# Patient Record
Sex: Female | Born: 1970 | Race: Black or African American | Hispanic: No | Smoking: Never smoker
Health system: Southern US, Community
[De-identification: ages and names within clinical notes are randomized; demographics above are authoritative.]

## PROBLEM LIST (undated history)

## (undated) DIAGNOSIS — E119 Type 2 diabetes mellitus without complications: Secondary | ICD-10-CM

## (undated) DIAGNOSIS — E785 Hyperlipidemia, unspecified: Secondary | ICD-10-CM

## (undated) DIAGNOSIS — U071 COVID-19: Secondary | ICD-10-CM

## (undated) DIAGNOSIS — I10 Essential (primary) hypertension: Secondary | ICD-10-CM

## (undated) HISTORY — DX: Hyperlipidemia, unspecified: E78.5

## (undated) HISTORY — DX: Essential (primary) hypertension: I10

## (undated) HISTORY — DX: COVID-19: U07.1

## (undated) HISTORY — PX: HAND SURGERY: SHX662

## (undated) HISTORY — DX: Type 2 diabetes mellitus without complications: E11.9

---

## 2007-02-04 ENCOUNTER — Ambulatory Visit: Payer: Self-pay | Admitting: Family Medicine

## 2009-02-19 ENCOUNTER — Ambulatory Visit: Payer: Self-pay | Admitting: Family Medicine

## 2010-03-03 ENCOUNTER — Ambulatory Visit: Payer: Self-pay | Admitting: Family Medicine

## 2011-03-15 ENCOUNTER — Ambulatory Visit: Payer: Self-pay | Admitting: Family Medicine

## 2013-10-03 DIAGNOSIS — E119 Type 2 diabetes mellitus without complications: Secondary | ICD-10-CM | POA: Insufficient documentation

## 2013-10-03 DIAGNOSIS — E785 Hyperlipidemia, unspecified: Secondary | ICD-10-CM | POA: Insufficient documentation

## 2013-10-03 DIAGNOSIS — I10 Essential (primary) hypertension: Secondary | ICD-10-CM | POA: Insufficient documentation

## 2015-09-16 ENCOUNTER — Other Ambulatory Visit: Payer: Self-pay | Admitting: Obstetrics & Gynecology

## 2015-09-16 DIAGNOSIS — Z1231 Encounter for screening mammogram for malignant neoplasm of breast: Secondary | ICD-10-CM

## 2015-10-11 ENCOUNTER — Other Ambulatory Visit: Payer: Self-pay | Admitting: Obstetrics & Gynecology

## 2015-10-11 ENCOUNTER — Ambulatory Visit
Admission: RE | Admit: 2015-10-11 | Discharge: 2015-10-11 | Disposition: A | Discharge status: Home / Self Care | Payer: 59 | Source: Ambulatory Visit | Attending: Obstetrics & Gynecology | Admitting: Obstetrics & Gynecology

## 2015-10-11 DIAGNOSIS — Z1231 Encounter for screening mammogram for malignant neoplasm of breast: Secondary | ICD-10-CM

## 2015-10-11 DIAGNOSIS — R928 Other abnormal and inconclusive findings on diagnostic imaging of breast: Secondary | ICD-10-CM | POA: Diagnosis not present

## 2015-10-14 ENCOUNTER — Other Ambulatory Visit: Payer: Self-pay | Admitting: Obstetrics & Gynecology

## 2015-10-14 DIAGNOSIS — N6320 Unspecified lump in the left breast, unspecified quadrant: Secondary | ICD-10-CM

## 2015-10-18 ENCOUNTER — Ambulatory Visit
Admission: RE | Admit: 2015-10-18 | Discharge: 2015-10-18 | Disposition: A | Discharge status: Home / Self Care | Payer: 59 | Source: Ambulatory Visit | Attending: Obstetrics & Gynecology | Admitting: Obstetrics & Gynecology

## 2015-10-18 DIAGNOSIS — N63 Unspecified lump in breast: Secondary | ICD-10-CM | POA: Diagnosis present

## 2015-10-18 DIAGNOSIS — N6320 Unspecified lump in the left breast, unspecified quadrant: Secondary | ICD-10-CM

## 2016-10-20 ENCOUNTER — Other Ambulatory Visit: Payer: Self-pay | Admitting: Obstetrics & Gynecology

## 2016-10-20 NOTE — Telephone Encounter (Signed)
Needs appt

## 2016-12-14 ENCOUNTER — Ambulatory Visit (INDEPENDENT_AMBULATORY_CARE_PROVIDER_SITE_OTHER): Payer: 59 | Admitting: Advanced Practice Midwife

## 2016-12-14 ENCOUNTER — Encounter: Payer: Self-pay | Admitting: Advanced Practice Midwife

## 2016-12-14 VITALS — BP 124/70 | Ht 63.0 in | Wt 129.0 lb

## 2016-12-14 DIAGNOSIS — Z30016 Encounter for initial prescription of transdermal patch hormonal contraceptive device: Secondary | ICD-10-CM | POA: Diagnosis not present

## 2016-12-14 DIAGNOSIS — Z01419 Encounter for gynecological examination (general) (routine) without abnormal findings: Secondary | ICD-10-CM

## 2016-12-14 DIAGNOSIS — B009 Herpesviral infection, unspecified: Secondary | ICD-10-CM | POA: Diagnosis not present

## 2016-12-14 DIAGNOSIS — Z0000 Encounter for general adult medical examination without abnormal findings: Secondary | ICD-10-CM

## 2016-12-14 MED ORDER — NORELGESTROMIN-ETH ESTRADIOL 150-35 MCG/24HR TD PTWK: 1.0000 | MEDICATED_PATCH | TRANSDERMAL | 12 refills | Status: DC

## 2016-12-14 MED ORDER — VALACYCLOVIR HCL 500 MG PO TABS: 500.0000 mg | ORAL_TABLET | Freq: Every day | ORAL | 5 refills | Status: DC

## 2016-12-14 NOTE — Patient Instructions (Signed)
Diabetes Mellitus and Food It is important for you to manage your blood sugar (glucose) level. Your blood glucose level can be greatly affected by what you eat. Eating healthier foods in the appropriate amounts throughout the day at about the same time each day will help you control your blood glucose level. It can also help slow or prevent worsening of your diabetes mellitus. Healthy eating may even help you improve the level of your blood pressure and reach or maintain a healthy weight. General recommendations for healthful eating and cooking habits include:  Eating meals and snacks regularly. Avoid going long periods of time without eating to lose weight.  Eating a diet that consists mainly of plant-based foods, such as fruits, vegetables, nuts, legumes, and whole grains.  Using low-heat cooking methods, such as baking, instead of high-heat cooking methods, such as deep frying.  Work with your dietitian to make sure you understand how to use the Nutrition Facts information on food labels. How can food affect me? Carbohydrates Carbohydrates affect your blood glucose level more than any other type of food. Your dietitian will help you determine how many carbohydrates to eat at each meal and teach you how to count carbohydrates. Counting carbohydrates is important to keep your blood glucose at a healthy level, especially if you are using insulin or taking certain medicines for diabetes mellitus. Alcohol Alcohol can cause sudden decreases in blood glucose (hypoglycemia), especially if you use insulin or take certain medicines for diabetes mellitus. Hypoglycemia can be a life-threatening condition. Symptoms of hypoglycemia (sleepiness, dizziness, and disorientation) are similar to symptoms of having too much alcohol. If your health care provider has given you approval to drink alcohol, do so in moderation and use the following guidelines:  Women should not have more than one drink per day, and men  should not have more than two drinks per day. One drink is equal to: ? 12 oz of beer. ? 5 oz of wine. ? 1 oz of hard liquor.  Do not drink on an empty stomach.  Keep yourself hydrated. Have water, diet soda, or unsweetened iced tea.  Regular soda, juice, and other mixers might contain a lot of carbohydrates and should be counted.  What foods are not recommended? As you make food choices, it is important to remember that all foods are not the same. Some foods have fewer nutrients per serving than other foods, even though they might have the same number of calories or carbohydrates. It is difficult to get your body what it needs when you eat foods with fewer nutrients. Examples of foods that you should avoid that are high in calories and carbohydrates but low in nutrients include:  Trans fats (most processed foods list trans fats on the Nutrition Facts label).  Regular soda.  Juice.  Candy.  Sweets, such as cake, pie, doughnuts, and cookies.  Fried foods.  What foods can I eat? Eat nutrient-rich foods, which will nourish your body and keep you healthy. The food you should eat also will depend on several factors, including:  The calories you need.  The medicines you take.  Your weight.  Your blood glucose level.  Your blood pressure level.  Your cholesterol level.  You should eat a variety of foods, including:  Protein. ? Lean cuts of meat. ? Proteins low in saturated fats, such as fish, egg whites, and beans. Avoid processed meats.  Fruits and vegetables. ? Fruits and vegetables that may help control blood glucose levels, such as apples,   mangoes, and yams.  Dairy products. ? Choose fat-free or low-fat dairy products, such as milk, yogurt, and cheese.  Grains, bread, pasta, and rice. ? Choose whole grain products, such as multigrain bread, whole oats, and brown rice. These foods may help control blood pressure.  Fats. ? Foods containing healthful fats, such as  nuts, avocado, olive oil, canola oil, and fish.  Does everyone with diabetes mellitus have the same meal plan? Because every person with diabetes mellitus is different, there is not one meal plan that works for everyone. It is very important that you meet with a dietitian who will help you create a meal plan that is just right for you. This information is not intended to replace advice given to you by your health care provider. Make sure you discuss any questions you have with your health care provider. Document Released: 11/24/2004 Document Revised: 08/05/2015 Document Reviewed: 01/24/2013 Elsevier Interactive Patient Education  2017 Elsevier Inc. Diabetes Mellitus and Exercise Exercising regularly is important for your overall health, especially when you have diabetes (diabetes mellitus). Exercising is not only about losing weight. It has many health benefits, such as increasing muscle strength and bone density and reducing body fat and stress. This leads to improved fitness, flexibility, and endurance, all of which result in better overall health. Exercise has additional benefits for people with diabetes, including:  Reducing appetite.  Helping to lower and control blood glucose.  Lowering blood pressure.  Helping to control amounts of fatty substances (lipids) in the blood, such as cholesterol and triglycerides.  Helping the body to respond better to insulin (improving insulin sensitivity).  Reducing how much insulin the body needs.  Decreasing the risk for heart disease by: ? Lowering cholesterol and triglyceride levels. ? Increasing the levels of good cholesterol. ? Lowering blood glucose levels.  What is my activity plan? Your health care provider or certified diabetes educator can help you make a plan for the type and frequency of exercise (activity plan) that works for you. Make sure that you:  Do at least 150 minutes of moderate-intensity or vigorous-intensity exercise each  week. This could be brisk walking, biking, or water aerobics. ? Do stretching and strength exercises, such as yoga or weightlifting, at least 2 times a week. ? Spread out your activity over at least 3 days of the week.  Get some form of physical activity every day. ? Do not go more than 2 days in a row without some kind of physical activity. ? Avoid being inactive for more than 90 minutes at a time. Take frequent breaks to walk or stretch.  Choose a type of exercise or activity that you enjoy, and set realistic goals.  Start slowly, and gradually increase the intensity of your exercise over time.  What do I need to know about managing my diabetes?  Check your blood glucose before and after exercising. ? If your blood glucose is higher than 240 mg/dL (13.3 mmol/L) before you exercise, check your urine for ketones. If you have ketones in your urine, do not exercise until your blood glucose returns to normal.  Know the symptoms of low blood glucose (hypoglycemia) and how to treat it. Your risk for hypoglycemia increases during and after exercise. Common symptoms of hypoglycemia can include: ? Hunger. ? Anxiety. ? Sweating and feeling clammy. ? Confusion. ? Dizziness or feeling light-headed. ? Increased heart rate or palpitations. ? Blurry vision. ? Tingling or numbness around the mouth, lips, or tongue. ? Tremors or shakes. ?   Irritability.  Keep a rapid-acting carbohydrate snack available before, during, and after exercise to help prevent or treat hypoglycemia.  Avoid injecting insulin into areas of the body that are going to be exercised. For example, avoid injecting insulin into: ? The arms, when playing tennis. ? The legs, when jogging.  Keep records of your exercise habits. Doing this can help you and your health care provider adjust your diabetes management plan as needed. Write down: ? Food that you eat before and after you exercise. ? Blood glucose levels before and after you  exercise. ? The type and amount of exercise you have done. ? When your insulin is expected to peak, if you use insulin. Avoid exercising at times when your insulin is peaking.  When you start a new exercise or activity, work with your health care provider to make sure the activity is safe for you, and to adjust your insulin, medicines, or food intake as needed.  Drink plenty of water while you exercise to prevent dehydration or heat stroke. Drink enough fluid to keep your urine clear or pale yellow. This information is not intended to replace advice given to you by your health care provider. Make sure you discuss any questions you have with your health care provider. Document Released: 05/20/2003 Document Revised: 09/17/2015 Document Reviewed: 08/09/2015 Elsevier Interactive Patient Education  2018 Elsevier Inc.  

## 2016-12-14 NOTE — Progress Notes (Signed)
Lori Beasley ID: Lori Beasley, female   DOB: 1970-08-27, 46 y.o.   MRN: [id number]    Gynecology Annual Exam  PCP: Lori Beasley, No Pcp Per  Chief Complaint:  Chief Complaint  Lori Beasley presents with  . Annual Exam    History of Present Illness: Lori Beasley is a 46 y.o. [name] presents for annual exam. The Lori Beasley has no complaints today. She has a request for patch for birth control and a request for refill of valtrex.  LMP: Lori Beasley's last menstrual period was 11/30/2016. Average Interval: regular, 28 days Duration of flow: 4 days Heavy Menses: no Clots: no Intermenstrual Bleeding: no Postcoital Bleeding: no Dysmenorrhea: no   The Lori Beasley is sexually active. She currently uses OCP (estrogen/progesterone) for contraception. She denies dyspareunia.  The Lori Beasley does perform self breast exams.  There is no notable family history of breast or ovarian cancer in her family.  The Lori Beasley wears seatbelts: yes.   The Lori Beasley has regular exercise: yes.    The Lori Beasley denies current symptoms of depression.    Review of Systems: Review of Systems  Constitutional: Negative.   HENT: Negative.   Eyes: Negative.   Respiratory: Negative.   Cardiovascular: Negative.   Gastrointestinal: Negative.   Genitourinary: Negative.   Musculoskeletal: Negative.   Skin: Negative.   Neurological: Negative.   Endo/Heme/Allergies: Negative.   Psychiatric/Behavioral: Negative.     Past Medical History:  Past Medical History:  Diagnosis Date  . Diabetes ([place])   . Hypertension     Past Surgical History:  History reviewed. No pertinent surgical history.  Gynecologic History:  Lori Beasley's last menstrual period was 11/30/2016. Contraception: OCP (estrogen/progesterone) Last Pap: 3 years ago Results were:  no abnormalities  Last mammogram: 1 year ago Results were: BI-RAD I Obstetric History: [id number]  Family History:  Family History  Problem Relation Age of Onset  . Breast cancer Maternal Grandmother         over 40    Social History:  Social History   Social History  . Marital status: Single    Spouse name: N/A  . Number of children: N/A  . Years of education: N/A   Occupational History  . Not on file.   Social History Main Topics  . Smoking status: Never Smoker  . Smokeless tobacco: Never Used  . Alcohol use No  . Drug use: No  . Sexual activity: Not Currently    Birth control/ protection: None   Other Topics Concern  . Not on file   Social History Narrative  . No narrative on file    Allergies:  No Known Allergies  Medications: Prior to Admission medications   Medication Sig Start Date End Date Taking? Authorizing Provider  amLODipine (NORVASC) 5 MG tablet Take by mouth. 05/28/16  Yes [provider]  Blood Glucose Monitoring Suppl (FIFTY50 GLUCOSE METER 2.0) w/Device KIT Frequency:PHARMDIR   Dosage:0.0     Instructions:  Note:To check blood sugar twice daily. ICD9 code 250.00 Dose: 1 05/13/07  Yes [provider]  glucose blood (BAYER CONTOUR TEST) test strip Frequency:QD   Dosage:0.0     Instructions:  Note:Dose: 1 06/10/07  Yes [provider]  Lancets MISC Frequency:QD   Dosage:0.0     Instructions:  Note:Dose: 1 06/10/07  Yes [provider]  lisinopril (PRINIVIL,ZESTRIL) 40 MG tablet Take by mouth. 05/28/16  Yes [provider]  metFORMIN (GLUCOPHAGE) 500 MG tablet TAKE 1 TABLET BY MOUTH 3  TIMES DAILY 10/24/16  Yes [provider]  pravastatin (PRAVACHOL) 20 MG tablet Take 40 mg by mouth. 03/21/12  Yes [provider]  norelgestromin-ethinyl estradiol (ORTHO EVRA) 150-35 MCG/24HR transdermal patch Place 1 patch onto the skin once a week. 12/14/16   Rod Can, CNM  valACYclovir (VALTREX) 500 MG tablet Take 1 tablet (500 mg total) by mouth daily. 12/14/16   Rod Can, CNM    Physical Exam Vitals: Blood pressure 124/70, height '5\' 3"'$  (1.6 m), weight 129 lb (58.5 kg), last menstrual period  11/30/2016.  General: NAD HEENT: normocephalic, anicteric Thyroid: no enlargement, no palpable nodules Pulmonary: No increased work of breathing, CTAB Cardiovascular: RRR, distal pulses 2+ Breast: Breast symmetrical, no tenderness, no palpable nodules or masses, no skin or nipple retraction present, no nipple discharge.  No axillary or supraclavicular lymphadenopathy. Abdomen: NABS, soft, non-tender, non-distended.  Umbilicus without lesions.  No hepatomegaly, splenomegaly or masses palpable. No evidence of hernia  Genitourinary: Deferred for no concerns and PAP screening interval Extremities: no edema, erythema, or tenderness Neurologic: Grossly intact Psychiatric: mood appropriate, affect full   Assessment: 46 y.o. [id number] routine annual exam  Plan: Problem List Items Addressed This Visit    None    Visit Diagnoses    Well woman exam without gynecological exam    -  Primary   Relevant Orders   MM DIGITAL SCREENING BILATERAL   Ambulatory referral to Gastroenterology   Herpes simplex infection       Relevant Medications   valACYclovir (VALTREX) 500 MG tablet   Encounter for initial prescription of transdermal patch hormonal contraceptive device       Relevant Medications   norelgestromin-ethinyl estradiol (ORTHO EVRA) 150-35 MCG/24HR transdermal patch      1) Mammogram - recommend yearly screening mammogram.  Mammogram Was ordered today   2) STI screening was offered and declined  3) ASCCP guidelines and rational discussed.  Lori Beasley opts for every 5 years screening interval  4) Contraception - Education given regarding options for contraception, including condoms, patch, ring, pills, IUD, Nexplanon, permanent sterilization. Lori Beasley chooses patch.  5) Colonoscopy -- Screening recommended starting at age 8 for average risk individuals, age 5 for individuals deemed at increased risk (including African Americans) and recommended to continue until age 31.  For Lori Beasley age  43-85 individualized approach is recommended.  Gold standard screening is via colonoscopy, Cologuard screening is an acceptable alternative for Lori Beasley unwilling or unable to undergo colonoscopy.  "Colorectal cancer screening for average?risk adults: 2018 guideline update from the [place]: A Cancer Journal for Clinicians: Aug 09, 2016. Referral sent for screening colonoscopy.  6) Routine healthcare maintenance including cholesterol, diabetes screening discussed managed by PCP   7) Continue healthy lifestyle diet and exercise and blood glucose control  8) Return to clinic in 1 year for annual exam  Rod Can, CNM

## 2017-01-17 ENCOUNTER — Other Ambulatory Visit: Payer: Self-pay | Admitting: Obstetrics & Gynecology

## 2017-04-24 ENCOUNTER — Ambulatory Visit
Admission: RE | Admit: 2017-04-24 | Discharge: 2017-04-24 | Disposition: A | Discharge status: Home / Self Care | Payer: Managed Care, Other (non HMO) | Source: Ambulatory Visit | Attending: Advanced Practice Midwife | Admitting: Advanced Practice Midwife

## 2017-04-24 DIAGNOSIS — Z1231 Encounter for screening mammogram for malignant neoplasm of breast: Secondary | ICD-10-CM | POA: Insufficient documentation

## 2017-04-24 DIAGNOSIS — Z0000 Encounter for general adult medical examination without abnormal findings: Secondary | ICD-10-CM

## 2017-04-25 ENCOUNTER — Other Ambulatory Visit: Payer: Self-pay | Admitting: Advanced Practice Midwife

## 2017-04-25 DIAGNOSIS — R928 Other abnormal and inconclusive findings on diagnostic imaging of breast: Secondary | ICD-10-CM

## 2017-04-25 DIAGNOSIS — N6489 Other specified disorders of breast: Secondary | ICD-10-CM

## 2017-05-03 ENCOUNTER — Ambulatory Visit
Admission: RE | Admit: 2017-05-03 | Discharge: 2017-05-03 | Disposition: A | Discharge status: Home / Self Care | Payer: Managed Care, Other (non HMO) | Source: Ambulatory Visit | Attending: Advanced Practice Midwife | Admitting: Advanced Practice Midwife

## 2017-05-03 DIAGNOSIS — N6489 Other specified disorders of breast: Secondary | ICD-10-CM

## 2017-05-03 DIAGNOSIS — R928 Other abnormal and inconclusive findings on diagnostic imaging of breast: Secondary | ICD-10-CM | POA: Diagnosis not present

## 2017-08-22 ENCOUNTER — Ambulatory Visit: Payer: Managed Care, Other (non HMO) | Admitting: Family Medicine

## 2017-08-22 ENCOUNTER — Encounter: Payer: Self-pay | Admitting: Family Medicine

## 2017-08-22 ENCOUNTER — Encounter (INDEPENDENT_AMBULATORY_CARE_PROVIDER_SITE_OTHER): Payer: Self-pay

## 2017-08-22 VITALS — BP 146/84 | HR 94 | Temp 98.4°F | Ht 63.5 in | Wt 130.0 lb

## 2017-08-22 DIAGNOSIS — E119 Type 2 diabetes mellitus without complications: Secondary | ICD-10-CM | POA: Diagnosis not present

## 2017-08-22 DIAGNOSIS — Z1321 Encounter for screening for nutritional disorder: Secondary | ICD-10-CM

## 2017-08-22 DIAGNOSIS — I10 Essential (primary) hypertension: Secondary | ICD-10-CM

## 2017-08-22 DIAGNOSIS — Z7689 Persons encountering health services in other specified circumstances: Secondary | ICD-10-CM | POA: Diagnosis not present

## 2017-08-22 DIAGNOSIS — Z23 Encounter for immunization: Secondary | ICD-10-CM

## 2017-08-22 MED ORDER — LISINOPRIL 40 MG PO TABS: 40.0000 mg | ORAL_TABLET | Freq: Every day | ORAL | 3 refills | Status: DC

## 2017-08-22 MED ORDER — AMLODIPINE BESYLATE 5 MG PO TABS: 5.0000 mg | ORAL_TABLET | Freq: Every day | ORAL | 3 refills | Status: DC

## 2017-08-22 NOTE — Patient Instructions (Addendum)
Great to see you today  I will notify you of your lab results  I will send in your metformin and pravachol after I get your blood work results  Please schedule your complete physical exam visit for 6 months from now

## 2017-08-22 NOTE — Progress Notes (Signed)
Subjective:    Patient ID: Lenell Antuheresa L Nicks, female    DOB: 08/29/1970, 47 y.o.   MRN: [id number]  HPI This is a 47 yo female who presents today to establish care. 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 Has 2 children and 2 grandchildren. Daughter and granddaughter live with her. She spends a lot of time with her grandchildren.    Last CPE- 04/24/17 Mammo- 04/24/17 Pap- negative 2016, follows gyn Colonoscopy- never Tdap- 02/07/2010 Flu-most years Eye- due this year  Dental- regular Exercise- walks Pneumococcal- computer showing she had 23 x 2, but never 13, will have today  DM type 2- checks occasionally, runs under 200, counts carbs and carefully watches her diet  HTN- on lisinopril and amlodipine, denies side effects  Denies chest pain, SOB, headaches, abdominal pain, nausea/vomitng, diarrhea/constipation, no dysuria/hematuria/frquency, no muscle/joint pain.     Past Medical History:  Diagnosis Date  . Diabetes ([facility])   . Hypertension    No past surgical history on file. Family History  Problem Relation Age of Onset  . Breast cancer Maternal Grandmother        over 8150   Social History   Tobacco Use  . Smoking status: Never Smoker  . Smokeless tobacco: Never Used  Substance Use Topics  . Alcohol use: No  . Drug use: No      Review of Systems    per HPI Objective:   Physical Exam Physical Exam  Constitutional: Oriented to person, place, and time. She appears well-developed and well-nourished.  HENT:  Head: Normocephalic and atraumatic.  Eyes: Conjunctivae are normal.  Neck: Normal range of motion. Neck supple.  Cardiovascular: Normal rate, regular rhythm and normal heart sounds.   Pulmonary/Chest: Effort normal and breath sounds normal.  Musculoskeletal: Normal range of motion.  Neurological: Alert and oriented to person, place, and time.  Skin: Skin is warm and dry.  Psychiatric: Normal mood and affect. Behavior is  normal. Judgment and thought content normal.  Vitals reviewed.     BP (!) 146/84 (BP Location: Right Arm, Patient Position: Sitting, Cuff Size: Normal)   Pulse 94   Temp 98.4 F (36.9 C) (Oral)   Ht 5' 3.5" (1.613 m)   Wt 130 lb (59 kg)   LMP 07/13/2017   SpO2 99%   BMI 22.67 kg/m  BP Readings from Last 3 Encounters:  08/22/17 (!) 146/84  12/14/16 124/70   Wt Readings from Last 3 Encounters:  08/22/17 130 lb (59 kg)  12/14/16 129 lb (58.5 kg)       Assessment & Plan:  1. Encounter to establish care - Discussed and encouraged healthy lifestyle choices- adequate sleep, regular exercise, stress management and healthy food choices.    2. Essential hypertension - a little high today, will continue to monitor and continue current meds - amLODipine (NORVASC) 5 MG tablet; Take 1 tablet (5 mg total) by mouth daily.  Dispense: 90 tablet; Refill: 3 - lisinopril (PRINIVIL,ZESTRIL) 40 MG tablet; Take 1 tablet (40 mg total) by mouth daily.  Dispense: 90 tablet; Refill: 3 - CBC with Differential - Comprehensive metabolic panel - Lipid Panel - TSH  3. Type 2 diabetes mellitus without complication, without long-term current use of insulin ([facility]CC) - CBC with Differential - Comprehensive metabolic panel - Lipid Panel - Hemoglobin A1c - TSH  4. Encounter for vitamin deficiency screening - Vitamin D, 25-hydroxy  5. Need for vaccination with 13-polyvalent pneumococcal conjugate vaccine - Pneumococcal conjugate  vaccine 13-valent   Olean Ree, FNP-BC  [facility], [facility]  08/22/2017 5:16 PM

## 2017-08-23 LAB — TSH: TSH: 1.2 u[IU]/mL (ref 0.35–4.50)

## 2017-08-23 LAB — CBC WITH DIFFERENTIAL/PLATELET
BASOS ABS: 0.1 10*3/uL (ref 0.0–0.1)
Basophils Relative: 1.3 % (ref 0.0–3.0)
EOS PCT: 5 % (ref 0.0–5.0)
Eosinophils Absolute: 0.5 10*3/uL (ref 0.0–0.7)
HEMATOCRIT: 40.7 % (ref 36.0–46.0)
Hemoglobin: 13.6 g/dL (ref 12.0–15.0)
LYMPHS PCT: 38.4 % (ref 12.0–46.0)
Lymphs Abs: 3.5 10*3/uL (ref 0.7–4.0)
MCHC: 33.4 g/dL (ref 30.0–36.0)
MCV: 91 fl (ref 78.0–100.0)
MONOS PCT: 7.1 % (ref 3.0–12.0)
Monocytes Absolute: 0.6 10*3/uL (ref 0.1–1.0)
NEUTROS ABS: 4.4 10*3/uL (ref 1.4–7.7)
Neutrophils Relative %: 48.2 % (ref 43.0–77.0)
Platelets: 390 10*3/uL (ref 150.0–400.0)
RBC: 4.47 Mil/uL (ref 3.87–5.11)
RDW: 13 % (ref 11.5–15.5)
WBC: 9.1 10*3/uL (ref 4.0–10.5)

## 2017-08-23 LAB — LIPID PANEL
CHOLESTEROL: 246 mg/dL — AB (ref 0–200)
HDL: 80.5 mg/dL (ref >39.00)
LDL Cholesterol: 148 mg/dL — ABNORMAL HIGH (ref 0–99)
NonHDL: 165.37
TRIGLYCERIDES: 86 mg/dL (ref 0.0–149.0)
Total CHOL/HDL Ratio: 3
VLDL: 17.2 mg/dL (ref 0.0–40.0)

## 2017-08-23 LAB — HEMOGLOBIN A1C: Hgb A1c MFr Bld: 7.1 % — ABNORMAL HIGH (ref 4.6–6.5)

## 2017-08-23 LAB — COMPREHENSIVE METABOLIC PANEL
ALK PHOS: 60 U/L (ref 39–117)
ALT: 13 U/L (ref 0–35)
AST: 15 U/L (ref 0–37)
Albumin: 4.3 g/dL (ref 3.5–5.2)
BILIRUBIN TOTAL: 0.3 mg/dL (ref 0.2–1.2)
BUN: 17 mg/dL (ref 6–23)
CALCIUM: 9.9 mg/dL (ref 8.4–10.5)
CO2: 24 mEq/L (ref 19–32)
Chloride: 103 mEq/L (ref 96–112)
Creatinine, Ser: 1.09 mg/dL (ref 0.40–1.20)
GFR: 68.75 mL/min (ref >60.00)
Glucose, Bld: 99 mg/dL (ref 70–99)
POTASSIUM: 4.9 meq/L (ref 3.5–5.1)
Sodium: 136 mEq/L (ref 135–145)
TOTAL PROTEIN: 7.3 g/dL (ref 6.0–8.3)

## 2017-08-23 LAB — VITAMIN D 25 HYDROXY (VIT D DEFICIENCY, FRACTURES): VITD: 26.05 ng/mL — ABNORMAL LOW (ref 30.00–100.00)

## 2017-08-27 MED ORDER — PRAVASTATIN SODIUM 40 MG PO TABS: 40.0000 mg | ORAL_TABLET | Freq: Every day | ORAL | 3 refills | Status: DC

## 2017-08-27 MED ORDER — METFORMIN HCL ER (MOD) 500 MG PO TB24: 500.0000 mg | ORAL_TABLET | Freq: Every day | ORAL | 3 refills | Status: DC

## 2017-08-27 NOTE — Addendum Note (Signed)
Addended by: Olean ReeGESSNER, DEBORAH B on: 08/27/2017 04:51 PM   Modules accepted: Orders

## 2017-09-05 ENCOUNTER — Other Ambulatory Visit: Payer: Self-pay | Admitting: Family Medicine

## 2017-09-05 ENCOUNTER — Telehealth: Payer: Self-pay | Admitting: Family Medicine

## 2017-09-05 MED ORDER — METFORMIN HCL ER 500 MG PO TB24: 500.0000 mg | ORAL_TABLET | Freq: Every day | ORAL | 3 refills | Status: DC

## 2017-09-05 NOTE — Telephone Encounter (Signed)
Please Advise

## 2017-09-05 NOTE — Telephone Encounter (Signed)
Copied from CRM [phone]#[id number]. Topic: Quick Communication - See Telephone Encounter >> Sep 05, 2017 11:32 AM Trula SladeWalter, Linda F wrote: CRM for notification. See Telephone encounter for: 09/05/17. Patient was told that the medication that was called into [facility] for her metFORMIN (GLUMETZA) 500 MG (MOD) 24 hr tablet was $5,000.  They said they have a generic for it if the doctor approves.  It is Metformin ER.  Can this be used?  Please advise.

## 2017-09-05 NOTE — Telephone Encounter (Signed)
Please call patient and tell her that new prescription sent to her mail order pharmacy.

## 2017-09-06 NOTE — Telephone Encounter (Signed)
Called and spoke to patient nothing further needed at this time.

## 2018-01-16 ENCOUNTER — Other Ambulatory Visit: Payer: Self-pay | Admitting: Obstetrics & Gynecology

## 2018-02-17 ENCOUNTER — Other Ambulatory Visit: Payer: Self-pay | Admitting: Family Medicine

## 2018-02-17 DIAGNOSIS — E119 Type 2 diabetes mellitus without complications: Secondary | ICD-10-CM

## 2018-02-17 DIAGNOSIS — E782 Mixed hyperlipidemia: Secondary | ICD-10-CM

## 2018-02-17 NOTE — Progress Notes (Signed)
CPE labs entered.  

## 2018-02-18 ENCOUNTER — Other Ambulatory Visit (INDEPENDENT_AMBULATORY_CARE_PROVIDER_SITE_OTHER): Payer: Managed Care, Other (non HMO)

## 2018-02-18 DIAGNOSIS — E119 Type 2 diabetes mellitus without complications: Secondary | ICD-10-CM | POA: Diagnosis not present

## 2018-02-18 DIAGNOSIS — E782 Mixed hyperlipidemia: Secondary | ICD-10-CM

## 2018-02-19 LAB — LIPID PANEL
Chol/HDL Ratio: 2.2 ratio (ref 0.0–4.4)
Cholesterol, Total: 196 mg/dL (ref 100–199)
HDL: 89 mg/dL (ref >39)
LDL Calculated: 89 mg/dL (ref 0–99)
Triglycerides: 89 mg/dL (ref 0–149)
VLDL Cholesterol Cal: 18 mg/dL (ref 5–40)

## 2018-02-19 LAB — HEMOGLOBIN A1C
ESTIMATED AVERAGE GLUCOSE: 146 mg/dL
Hgb A1c MFr Bld: 6.7 % — ABNORMAL HIGH (ref 4.8–5.6)

## 2018-02-25 ENCOUNTER — Other Ambulatory Visit (HOSPITAL_COMMUNITY)
Admission: RE | Admit: 2018-02-25 | Discharge: 2018-02-25 | Disposition: A | Discharge status: Home / Self Care | Payer: Managed Care, Other (non HMO) | Source: Ambulatory Visit | Attending: Family Medicine | Admitting: Family Medicine

## 2018-02-25 ENCOUNTER — Encounter: Payer: Self-pay | Admitting: Family Medicine

## 2018-02-25 ENCOUNTER — Ambulatory Visit (INDEPENDENT_AMBULATORY_CARE_PROVIDER_SITE_OTHER): Payer: Managed Care, Other (non HMO) | Admitting: Family Medicine

## 2018-02-25 VITALS — BP 136/78 | HR 96 | Temp 98.4°F | Ht 63.5 in | Wt 129.1 lb

## 2018-02-25 DIAGNOSIS — Z124 Encounter for screening for malignant neoplasm of cervix: Secondary | ICD-10-CM | POA: Diagnosis not present

## 2018-02-25 DIAGNOSIS — Z0000 Encounter for general adult medical examination without abnormal findings: Secondary | ICD-10-CM

## 2018-02-25 DIAGNOSIS — E119 Type 2 diabetes mellitus without complications: Secondary | ICD-10-CM | POA: Diagnosis not present

## 2018-02-25 DIAGNOSIS — M25511 Pain in right shoulder: Secondary | ICD-10-CM | POA: Diagnosis not present

## 2018-02-25 DIAGNOSIS — G8929 Other chronic pain: Secondary | ICD-10-CM

## 2018-02-25 NOTE — Progress Notes (Signed)
Subjective:    Patient ID: Lori Beasley, female    DOB: 03/12/1971, 47 y.o.   MRN: [id number]  HPI This is a 47 yo female who presents today for CPE. Has been doing well.   Last CPE- 04/24/17- gyn Mammo-05/03/17 Pap- negative 2016, sees gyn Tdap-02/07/2010 Flu- annual Eye- not this year Exercise- not as regular as before, taking care of her grandson in the afternoons.   Past Medical History:  Diagnosis Date  . Diabetes (HCC)   . Hypertension    No past surgical history on file. Family History  Problem Relation Age of Onset  . Breast cancer Maternal Grandmother        over 2350   Social History   Tobacco Use  . Smoking status: Never Smoker  . Smokeless tobacco: Never Used  Substance Use Topics  . Alcohol use: No  . Drug use: No      Review of Systems  Constitutional: Negative.   HENT: Negative.   Respiratory: Negative.   Cardiovascular: Negative.   Gastrointestinal: Negative.   Endocrine: Negative.   Genitourinary: Negative.   Musculoskeletal:       Right shoulder and upper arm pain. Was seen by ortho last year and had injection followed by oral steroids. Got better until this year. She works as a Glass blower/designerpacker and is right hand dominant. Has not taken any medication for pain. Some relief with heat.   Skin: Negative.   Allergic/Immunologic: Negative.   Neurological: Negative.   Hematological: Negative.   Psychiatric/Behavioral: Negative.        Objective:   Physical Exam Physical Exam  Constitutional: She is oriented to person, place, and time. She appears well-developed and well-nourished. No distress.  HENT:  Head: Normocephalic and atraumatic.  Right Ear: External ear normal.  Left Ear: External ear normal.  Nose: Nose normal.  Mouth/Throat: Oropharynx is clear and moist. No oropharyngeal exudate.  Eyes: Conjunctivae are normal. Pupils are equal, round, and reactive to light.  Neck: Normal range of motion. Neck supple. No JVD present. No thyromegaly  present.  Cardiovascular: Normal rate, regular rhythm, normal heart sounds and intact distal pulses.   Pulmonary/Chest: Effort normal and breath sounds normal. Right breast exhibits no inverted nipple, no mass, no nipple discharge, no skin change and no tenderness. Left breast exhibits no inverted nipple, no mass, no nipple discharge, no skin change and no tenderness. Breasts are symmetrical.  Abdominal: Soft. Bowel sounds are normal. She exhibits no distension and no mass. There is no tenderness. There is no rebound and no guarding.  Genitourinary: Vagina normal. Pelvic exam was performed with patient supine. There is no rash, tenderness, lesion or injury on the right labia. There is no rash, tenderness, lesion or injury on the left labia. Cervix exhibits no motion tenderness and no discharge. No vaginal discharge found.  Musculoskeletal: No LE edema. Right arm and shoulder with full ROM, normal strength, non tender Lymphadenopathy:    She has no cervical adenopathy.  Neurological: She is alert and oriented to person, place, and time. She has normal reflexes.  Skin: Skin is warm and dry. She is not diaphoretic.  Psychiatric: She has a normal mood and affect. Her behavior is normal. Judgment and thought content normal.  Vitals reviewed.     BP 136/78 (BP Location: Right Arm, Patient Position: Sitting)   Pulse 96   Temp 98.4 F (36.9 C) (Oral)   Ht 5' 3.5" (1.613 m)   Wt 129 lb 1.9 oz (58.6 kg)  SpO2 98%   BMI 22.51 kg/m  Wt Readings from Last 3 Encounters:  02/25/18 129 lb 1.9 oz (58.6 kg)  08/22/17 130 lb (59 kg)  12/14/16 129 lb (58.5 kg)   Diabetic foot exam: Normal inspection No skin breakdown No calluses  Normal DP pulses Normal sensation to light touch and monofilament Nails normal    Assessment & Plan:  1. Annual physical exam - Discussed and encouraged healthy lifestyle choices- adequate sleep, regular exercise, stress management and healthy food choices.    2.  Screening for cervical cancer - Cytology - PAP([facility])  3. Chronic right shoulder pain - discussed use of OTC analgesics/NSAIDs, heat, range of motion - if no improvement, consider returning to ortho or PT  4. Type 2 diabetes mellitus without complication, without long-term current use of insulin (HCC) - improved hgba1c and lipids, discussed with patient - encouraged her to make eye appointment   - follow up in 6 months   Olean Ree, FNP-BC  [facility], [facility]  02/25/2018 3:56 PM

## 2018-02-25 NOTE — Patient Instructions (Addendum)
Great to see you today  Your labs look great! Keep up the good work.   For your shoulder pain, sounds like arthritis. Can take ibuprofen 2 tablets or naprosyn 1 tablet twice a day as needed. Continue heat for comfort and do daily stretching exercises. Let me know if you want to go to physical therapy.   Please schedule your eye exam and have them send me the results  Follow up in 6 months  Keeping You Healthy  Get These Tests 1. Blood Pressure- Have your blood pressure checked once a year by your health care provider.  Normal blood pressure is 120/80. 2. Weight- Have your body mass index (BMI) calculated to screen for obesity.  BMI is measure of body fat based on height and weight.  You can also calculate your own BMI at https://www.west-esparza.com/www.nhlbisupport.com/bmi/. 3. Cholesterol- Have your cholesterol checked every 5 years starting at age 49 then yearly starting at age 10445. 4. Chlamydia, HIV, and other sexually transmitted diseases- Get screened every year until age 49, then within three months of each new sexual provider. 5. Pap Test - Every 1-5 years; discuss with your health care provider. 6. Mammogram- Every 1-2 years starting at age 49--50  Take these medicines  Calcium with Vitamin D-Your body needs 1200 mg of Calcium each day and [phone] IU of Vitamin D daily.  Your body can only absorb 500 mg of Calcium at a time so Calcium must be taken in 2 or 3 divided doses throughout the day.  Multivitamin with folic acid- Once daily if it is possible for you to become pregnant.  Get these Immunizations  Gardasil-Series of three doses; prevents HPV related illness such as genital warts and cervical cancer.  Menactra-Single dose; prevents meningitis.  Tetanus shot- Every 10 years.  Flu shot-Every year.  Take these steps 1. Do not smoke-Your healthcare provider can help you quit.  For tips on how to quit go to www.smokefree.gov or call 1-800 QUITNOW. 2. Be physically active- Exercise 5 days a week for  at least 30 minutes.  If you are not already physically active, start slow and gradually work up to 30 minutes of moderate physical activity.  Examples of moderate activity include walking briskly, dancing, swimming, bicycling, etc. 3. Breast Cancer- A self breast exam every month is important for early detection of breast cancer.  For more information and instruction on self breast exams, ask your healthcare provider or [place].eswww.womenshealth.gov/faq/breast-self-exam.cfm. 4. Eat a healthy diet- Eat a variety of healthy foods such as fruits, vegetables, whole grains, low fat milk, low fat cheeses, yogurt, lean meats, poultry and fish, beans, nuts, tofu, etc.  For more information go to www. Thenutritionsource.org 5. Drink alcohol in moderation- Limit alcohol intake to one drink or less per day. Never drink and drive. 6. Depression- Your emotional health is as important as your physical health.  If you're feeling down or losing interest in things you normally enjoy please talk to your healthcare provider about being screened for depression. 7. Dental visit- Brush and floss your teeth twice daily; visit your dentist twice a year. 8. Eye doctor- Get an eye exam at least every 2 years. 9. Helmet use- Always wear a helmet when riding a bicycle, motorcycle, rollerblading or skateboarding. 10. Safe sex- If you may be exposed to sexually transmitted infections, use a condom. 11. Seat belts- Seat belts can save your live; always wear one. 12. Smoke/Carbon Monoxide detectors- These detectors need to be installed on the appropriate level of  your home. Replace batteries at least once a year. 13. Skin cancer- When out in the sun please cover up and use sunscreen 15 SPF or higher. 14. Violence- If anyone is threatening or hurting you, please tell your healthcare provider.

## 2018-02-28 LAB — CYTOLOGY - PAP
Adequacy: ABSENT
Diagnosis: NEGATIVE
HPV: NOT DETECTED

## 2018-04-05 ENCOUNTER — Other Ambulatory Visit: Payer: Self-pay | Admitting: Obstetrics & Gynecology

## 2018-04-14 ENCOUNTER — Other Ambulatory Visit: Payer: Self-pay | Admitting: Obstetrics & Gynecology

## 2018-04-22 ENCOUNTER — Other Ambulatory Visit: Payer: Self-pay | Admitting: Family Medicine

## 2018-04-22 DIAGNOSIS — Z1231 Encounter for screening mammogram for malignant neoplasm of breast: Secondary | ICD-10-CM

## 2018-05-06 ENCOUNTER — Ambulatory Visit
Admission: RE | Admit: 2018-05-06 | Discharge: 2018-05-06 | Disposition: A | Discharge status: Home / Self Care | Payer: Managed Care, Other (non HMO) | Source: Ambulatory Visit | Attending: Family Medicine | Admitting: Family Medicine

## 2018-05-06 DIAGNOSIS — Z1231 Encounter for screening mammogram for malignant neoplasm of breast: Secondary | ICD-10-CM

## 2018-05-30 ENCOUNTER — Other Ambulatory Visit: Payer: Self-pay

## 2018-05-30 DIAGNOSIS — B009 Herpesviral infection, unspecified: Secondary | ICD-10-CM

## 2018-05-30 MED ORDER — VALACYCLOVIR HCL 500 MG PO TABS: 500.0000 mg | ORAL_TABLET | Freq: Every day | ORAL | 0 refills | Status: DC

## 2018-05-30 NOTE — Telephone Encounter (Signed)
Pt needs a refill on Valtrex. [facility]

## 2018-05-30 NOTE — Telephone Encounter (Signed)
Rx sent electronically.  

## 2018-06-12 ENCOUNTER — Other Ambulatory Visit: Payer: Self-pay

## 2018-06-12 ENCOUNTER — Encounter: Payer: Self-pay | Admitting: Family Medicine

## 2018-06-12 ENCOUNTER — Ambulatory Visit (INDEPENDENT_AMBULATORY_CARE_PROVIDER_SITE_OTHER): Payer: Managed Care, Other (non HMO) | Admitting: Family Medicine

## 2018-06-12 DIAGNOSIS — F411 Generalized anxiety disorder: Secondary | ICD-10-CM | POA: Diagnosis not present

## 2018-06-12 DIAGNOSIS — F430 Acute stress reaction: Secondary | ICD-10-CM | POA: Diagnosis not present

## 2018-06-12 DIAGNOSIS — E119 Type 2 diabetes mellitus without complications: Secondary | ICD-10-CM | POA: Diagnosis not present

## 2018-06-12 NOTE — Progress Notes (Signed)
Virtual Visit via Video Note  I connected with Lori Beasley on 06/12/18 at 11:28 AM EDT by a video enabled telemedicine application and verified that I am speaking with the correct person using two identifiers.   I discussed the limitations of evaluation and management by telemedicine and the availability of in person appointments. The patient expressed understanding and agreed to proceed.  History of Present Illness: This is a 48 year old female who requests a virtual visit today to discuss several concerns.  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  She reports that her stress level is very high and that she does not feel that she is able to continue to work at her job due to lack of support for the employees.  She reports that the workplace has not been facilitating social distancing and that she is shares an office with 2 other people and is unable to maintain at least 6 feet of separation.  She reports that there are no health checks and she is very concerned that she will bring COVID-19 home to her family.  She is not sleeping well having difficulty falling and staying asleep.  This increase in anxiety and stress are new for her.  She reports prior to this she was doing well and coping without difficulty.  She reports that her blood sugars are running in the 130-190 range which is high for her.  She continues to take her metformin daily.  She denies any recent change in her diet or activity level.  She has asked to be allowed to work from home but her request has been denied.  She was told she could go on FMLA.  Observations/Objective: The patient was alert and oriented and answers questions appropriately.  Her breathing was unlabored.  She was moderately emotional when talking about her stressors.  Assessment and Plan: 1. Anxiety as acute reaction to exceptional stress -Patient  feels that her best option is to take FMLA until reasonable risk of contracting COVID-19 has passed -Given her history of diabetes I think this is reasonable and have told her to send me necessary paperwork -We also discussed ways to decrease anxiety and improve sleep and a printed AVS with  information will be mailed -She was instructed to follow-up in a couple of weeks if no improvement in symptoms  2. Type 2 diabetes mellitus without complication, without long-term current use of insulin (HCC) -Discussed effects of stress on blood sugar and encouraged her to watch diet carefully and increase activity as able -She has follow-up on file to recheck her hemoglobin A1c   Lori Ree, FNP-BC  [facility], [facility]  06/12/2018 11:53 AM   Follow Up Instructions:    I discussed the assessment and treatment plan with the patient. The patient was provided an opportunity to ask questions and all were answered. The patient agreed with the plan and demonstrated an understanding of the instructions.   The patient was advised to call back or seek an in-person evaluation if the symptoms worsen or if the condition fails to improve as anticipated.   Emi Belfast, FNP

## 2018-06-12 NOTE — Patient Instructions (Signed)
Hi Lori Beasley, it was good to talk to you today during a virtual visit.  I am sorry things are so stressful at work right now.  As I said, I will be happy to fill out your FMLA paperwork.  Below is some information about stress.  Recommend that she try to stay to a regular routine, with regular bedtime and morning hours.  Try to get some exercise every day.  Walking outside is good for her body and her mood!  If you do not feel like things are getting better in the next couple of weeks please be in touch. Warm regards,  Tor Netters, FNP-BC   Stress Stress is a normal reaction to life events. Stress is what you feel when life demands more than you are used to, or more than you think you can handle. Some stress can be useful, such as studying for a test or meeting a deadline at work. Stress that occurs too often or for too long can cause problems. It can affect your emotional health and interfere with relationships and normal daily activities. Too much stress can weaken your body's defense system (immune system) and increase your risk for physical illness. If you already have a medical problem, stress can make it worse. What are the causes? All sorts of life events can cause stress. An event that causes stress for one person may not be stressful for another person. Major life events, whether positive or negative, commonly cause stress. Examples include:  Losing a job or starting a new job.  Losing a loved one.  Moving to a new town or home.  Getting married or divorced.  Having a baby.  Injury or illness. Less obvious life events can also cause stress, especially if they occur day after day or in combination with each other. Examples include:  Working long hours.  Driving in traffic.  Caring for children.  Being in debt.  Being in a difficult relationship. What are the signs or symptoms? Stress can cause emotional symptoms, including:  Anxiety. This is feeling worried, afraid, on  edge, overwhelmed, or out of control.  Anger, including irritation or impatience.  Depression. This is feeling sad, down, helpless, or guilty.  Trouble focusing, remembering, or making decisions. Stress can cause physical symptoms, including:  Aches and pains. These may affect your head, neck, back, stomach, or other areas of your body.  Tight muscles or a clenched jaw.  Low energy.  Trouble sleeping. Stress can cause unhealthy behaviors, including:  Eating to feel better (overeating) or skipping meals.  Working too much or putting off tasks.  Smoking, drinking alcohol, or using drugs to feel better. How is this diagnosed? Stress is diagnosed through an assessment by your health care provider. He or she may diagnose this condition based on:  Your symptoms and any stressful life events.  Your medical history.  Tests to rule out other causes of your symptoms. Depending on your condition, your health care provider may refer you to a specialist for further evaluation. How is this treated?  Stress management techniques are the recommended treatment for stress. Medicine is not typically recommended for the treatment of stress. Techniques to reduce your reaction to stressful life events include:  Stress identification. Monitor yourself for symptoms of stress and identify what causes stress for you. These skills may help you to avoid or prepare for stressful events.  Time management. Set your priorities, keep a calendar of events, and learn to say "no." Taking these actions can  help you avoid making too many commitments. Techniques for coping with stress include:  Rethinking the problem. Try to think realistically about stressful events rather than ignoring them or overreacting. Try to find the positives in a stressful situation rather than focusing on the negatives.  Exercise. Physical exercise can release both physical and emotional tension. The key is to find a form of exercise  that you enjoy and do it regularly.  Relaxation techniques. These relax the body and mind. The key is to find one or more that you enjoy and use the technique(s) regularly. Examples include: ? Meditation, deep breathing, or progressive relaxation techniques. ? Yoga or tai chi. ? Biofeedback, mindfulness techniques, or journaling. ? Listening to music, being out in nature, or participating in other hobbies.  Practicing a healthy lifestyle. Eat a balanced diet, drink plenty of water, limit or avoid caffeine, and get plenty of sleep.  Having a strong support network. Spend time with family, friends, or other people you enjoy being around. Express your feelings and talk things over with someone you trust. Counseling or talk therapy with a mental health professional may be helpful if you are having trouble managing stress on your own. Follow these instructions at home: Lifestyle   Avoid drugs.  Do not use any products that contain nicotine or tobacco, such as cigarettes and e-cigarettes. If you need help quitting, ask your health care provider.  Limit alcohol intake to no more than 1 drink a day for nonpregnant women and 2 drinks a day for men. One drink equals 12 oz of beer, 5 oz of wine, or 1 oz of hard liquor.  Do not use alcohol or drugs to relax.  Eat a balanced diet that includes fresh fruits and vegetables, whole grains, lean meats, fish, eggs, and beans, and low-fat dairy. Avoid processed foods and foods high in added fat, sugar, and salt.  Exercise at least 30 minutes on 5 or more days each week.  Get 7-8 hours of sleep each night. General instructions   Practice stress management techniques as discussed with your health care provider.  Drink enough fluid to keep your urine clear or pale yellow.  Take over-the-counter and prescription medicines only as told by your health care provider.  Keep all follow-up visits as told by your health care provider. This is important.  Contact a health care provider if:  Your symptoms get worse.  You have new symptoms.  You feel overwhelmed by your problems and can no longer manage them on your own. Get help right away if:  You have thoughts of hurting yourself or others. If you ever feel like you may hurt yourself or others, or have thoughts about taking your own life, get help right away. You can go to your nearest emergency department or call:  Your local emergency services (911 in the [place]  A suicide crisis helpline, such as the [place] at 6260743328. This is open 24 hours a day. Summary  Stress is a normal reaction to life events. It can cause problems if it happens too often or for too long.  Practicing stress management techniques is the best way to treat stress.  Counseling or talk therapy with a mental health professional may be helpful if you are having trouble managing stress on your own. This information is not intended to replace advice given to you by your health care provider. Make sure you discuss any questions you have with your health care provider. Document Released: 08/23/2000  Document Revised: 04/19/2016 Document Reviewed: 04/19/2016 Elsevier Interactive Patient Education  [employer].

## 2018-06-13 ENCOUNTER — Telehealth: Payer: Self-pay | Admitting: Family Medicine

## 2018-06-13 NOTE — Telephone Encounter (Signed)
fmla paperwork in Lori Beasley's in box For review and signature

## 2018-06-13 NOTE — Telephone Encounter (Signed)
Pt stated her start date is Monday April 6th

## 2018-06-13 NOTE — Telephone Encounter (Signed)
Spoke with pt  She will call me back to give me the start date for her fmla

## 2018-06-14 NOTE — Telephone Encounter (Signed)
Returned completed paperwork to [place]. h

## 2018-06-17 NOTE — Telephone Encounter (Signed)
Paperwork faxed °

## 2018-06-18 NOTE — Telephone Encounter (Signed)
Copy for pt °Copy for scan °Pt aware ° °

## 2018-06-20 ENCOUNTER — Telehealth: Payer: Self-pay

## 2018-06-20 NOTE — Telephone Encounter (Signed)
No need to quarantine herself unless she has symtpoms or direct first degree contact with COVID positive pt.

## 2018-06-20 NOTE — Telephone Encounter (Signed)
Pt works at [employer]; pt will start FMLA today but one of pts coworkers (coworker is not sick) daughter on 06/19/18 developed fever of 100.6, H/A and CP. pts coworker was advised by coworkers daughter physician that the daughter should be quarantined for 14 days. The coworker is not quarantined. Today the coworkers daughters symptoms were gone. Pt has no fever, cough, SOB and has not traveled and no known exposure to + corona or flu. Pt wants to know since she is in close contact with coworker at work (1-2 ft) does the pt need to quarantine herself. Harlin Heys FNP out of office.Please advise.

## 2018-06-22 ENCOUNTER — Other Ambulatory Visit: Payer: Self-pay | Admitting: Family Medicine

## 2018-06-22 DIAGNOSIS — B009 Herpesviral infection, unspecified: Secondary | ICD-10-CM

## 2018-06-24 NOTE — Telephone Encounter (Signed)
Lori Beasley notified as instructed by telephone.

## 2018-07-01 NOTE — Telephone Encounter (Signed)
Letter written and placed up front. 

## 2018-07-01 NOTE — Telephone Encounter (Signed)
Pt called to get a back to work note sometime this week, preferably Tues 4/21 or Wed 4/22. She feels she is ready to go back. [phone]

## 2018-07-02 NOTE — Telephone Encounter (Signed)
Letter faxed to [employer] at [phone] as requested.

## 2018-07-02 NOTE — Telephone Encounter (Signed)
°  Patient will come back to pick up the letter.   Patient is also needing the letter faxed to [facility] in order for her to return [place]- [phone]

## 2018-08-19 ENCOUNTER — Other Ambulatory Visit: Payer: Self-pay | Admitting: Family Medicine

## 2018-08-19 DIAGNOSIS — I10 Essential (primary) hypertension: Secondary | ICD-10-CM

## 2018-08-19 DIAGNOSIS — E119 Type 2 diabetes mellitus without complications: Secondary | ICD-10-CM

## 2018-08-19 DIAGNOSIS — E559 Vitamin D deficiency, unspecified: Secondary | ICD-10-CM

## 2018-08-30 ENCOUNTER — Ambulatory Visit: Payer: Managed Care, Other (non HMO) | Admitting: Family Medicine

## 2018-08-30 ENCOUNTER — Other Ambulatory Visit: Payer: Self-pay

## 2018-08-30 ENCOUNTER — Encounter: Payer: Self-pay | Admitting: Family Medicine

## 2018-08-30 ENCOUNTER — Ambulatory Visit (INDEPENDENT_AMBULATORY_CARE_PROVIDER_SITE_OTHER): Payer: Managed Care, Other (non HMO) | Admitting: Family Medicine

## 2018-08-30 DIAGNOSIS — I10 Essential (primary) hypertension: Secondary | ICD-10-CM | POA: Diagnosis not present

## 2018-08-30 DIAGNOSIS — E119 Type 2 diabetes mellitus without complications: Secondary | ICD-10-CM

## 2018-08-30 DIAGNOSIS — B009 Herpesviral infection, unspecified: Secondary | ICD-10-CM

## 2018-08-30 DIAGNOSIS — F430 Acute stress reaction: Secondary | ICD-10-CM

## 2018-08-30 DIAGNOSIS — F411 Generalized anxiety disorder: Secondary | ICD-10-CM | POA: Diagnosis not present

## 2018-08-30 MED ORDER — VALACYCLOVIR HCL 500 MG PO TABS: 500.0000 mg | ORAL_TABLET | Freq: Every day | ORAL | 1 refills | Status: DC

## 2018-08-30 MED ORDER — LISINOPRIL 40 MG PO TABS: 40.0000 mg | ORAL_TABLET | Freq: Every day | ORAL | 3 refills | Status: DC

## 2018-08-30 MED ORDER — AMLODIPINE BESYLATE 5 MG PO TABS: 5.0000 mg | ORAL_TABLET | Freq: Every day | ORAL | 3 refills | Status: DC

## 2018-08-30 MED ORDER — METFORMIN HCL ER 500 MG PO TB24: 500.0000 mg | ORAL_TABLET | Freq: Every day | ORAL | 3 refills | Status: DC

## 2018-08-30 MED ORDER — PRAVASTATIN SODIUM 40 MG PO TABS: 40.0000 mg | ORAL_TABLET | Freq: Every day | ORAL | 3 refills | Status: DC

## 2018-08-30 NOTE — Addendum Note (Signed)
Addended by: GESSNER, DEBORAH B on: 08/30/2018 08:21 AM   Modules accepted: Orders  

## 2018-08-30 NOTE — Addendum Note (Signed)
Addended by: Clarene Reamer B on: 08/30/2018 08:21 AM   Modules accepted: Orders

## 2018-08-30 NOTE — Patient Instructions (Signed)
Hi Lori Beasley,  It was nice to see you today for your virtual visit.  Please call the office at [phone] to schedule lab only visit.  It is time to check your diabetes blood work and urine.  Please follow-up in 6 months for your complete physical.  Warm regards,  Tor Netters, FNP-BC

## 2018-08-30 NOTE — Progress Notes (Signed)
Virtual Visit via Video Note  I connected with Lori Beasley on 08/30/18 at  8:00 AM EDT by a video enabled telemedicine application and verified that I am speaking with the correct person using two identifiers.  Location: Patient: At work Provider: [place]   I discussed the limitations of evaluation and management by telemedicine and the availability of in person appointments. The patient expressed understanding and agreed to proceed.  History of Present Illness: This is a 48 year old female who presents today for a virtual visit for 34-month follow-up of chronic medical conditions. Diabetes mellitus type 2- she reports that her blood sugars have been doing well.  She had some increased readings a couple of months ago with increased stress and anxiety related to coronavirus pandemic.  She reports that things are going better now and she has resumed walking.  Situational anxiety- at the beginning of coronavirus pandemic she had asked to be written out of work.  After several weeks she requested to go back to work and reports that things are going well and she has been coping without difficulties.  She denies chest pain, shortness of breath, lower extremity edema.   Observations/Objective: The patient is alert and answers questions appropriately.  Visible skin is unremarkable.  She is normally conversive without shortness of breath.  Her mood and affect are appropriate.   There were no vitals taken for this visit. Wt Readings from Last 3 Encounters:  02/25/18 129 lb 1.9 oz (58.6 kg)  08/22/17 130 lb (59 kg)  12/14/16 129 lb (58.5 kg)   BP Readings from Last 3 Encounters:  02/25/18 136/78  08/22/17 (!) 146/84  12/14/16 124/70     Assessment and Plan: 1. Type 2 diabetes mellitus without complication, without long-term current use of insulin (HCC) - Microalbumin / creatinine urine ratio; Future - Hemoglobin A1c; Future - pravastatin (PRAVACHOL) 40 MG tablet; Take 1  tablet (40 mg total) by mouth daily.  Dispense: 90 tablet; Refill: 3 - metFORMIN (GLUCOPHAGE-XR) 500 MG 24 hr tablet; Take 1 tablet (500 mg total) by mouth daily with breakfast.  Dispense: 90 tablet; Refill: 3  2. Essential hypertension - Microalbumin / creatinine urine ratio; Future - Hemoglobin A1c; Future - lisinopril (ZESTRIL) 40 MG tablet; Take 1 tablet (40 mg total) by mouth daily.  Dispense: 90 tablet; Refill: 3 - amLODipine (NORVASC) 5 MG tablet; Take 1 tablet (5 mg total) by mouth daily.  Dispense: 90 tablet; Refill: 3  3. Anxiety as acute reaction to exceptional stress - Patient reports resolution  4. Herpes simplex infection - will discuss changing to prn at next office visit - valACYclovir (VALTREX) 500 MG tablet; Take 1 tablet (500 mg total) by mouth daily.  Dispense: 90 tablet; Refill: 1  - follow up in 6 months for CPE  Lori Reamer, FNP-BC  [facility]  08/30/2018 8:28 AM   Follow Up Instructions: Patient without an active MyChart account so AVS was printed mailed to her home address.   I discussed the assessment and treatment plan with the patient. The patient was provided an opportunity to ask questions and all were answered. The patient agreed with the plan and demonstrated an understanding of the instructions.   The patient was advised to call back or seek an in-person evaluation if the symptoms worsen or if the condition fails to improve as anticipated.   Lori Beck, FNP

## 2018-09-12 ENCOUNTER — Other Ambulatory Visit (INDEPENDENT_AMBULATORY_CARE_PROVIDER_SITE_OTHER): Payer: Managed Care, Other (non HMO)

## 2018-09-12 ENCOUNTER — Other Ambulatory Visit: Payer: Self-pay

## 2018-09-12 DIAGNOSIS — E119 Type 2 diabetes mellitus without complications: Secondary | ICD-10-CM

## 2018-09-12 DIAGNOSIS — I10 Essential (primary) hypertension: Secondary | ICD-10-CM

## 2018-09-12 DIAGNOSIS — E559 Vitamin D deficiency, unspecified: Secondary | ICD-10-CM

## 2018-09-13 LAB — COMPREHENSIVE METABOLIC PANEL
ALT: 17 IU/L (ref 0–32)
AST: 19 IU/L (ref 0–40)
Albumin/Globulin Ratio: 1.7 (ref 1.2–2.2)
Albumin: 4.3 g/dL (ref 3.8–4.8)
Alkaline Phosphatase: 67 IU/L (ref 39–117)
BUN/Creatinine Ratio: 20 (ref 9–23)
BUN: 16 mg/dL (ref 6–24)
Bilirubin Total: <0.2 mg/dL (ref 0.0–1.2)
CO2: 19 mmol/L — ABNORMAL LOW (ref 20–29)
Calcium: 9 mg/dL (ref 8.7–10.2)
Chloride: 103 mmol/L (ref 96–106)
Creatinine, Ser: 0.8 mg/dL (ref 0.57–1.00)
GFR calc Af Amer: 100 mL/min/{1.73_m2} (ref >59)
GFR calc non Af Amer: 87 mL/min/{1.73_m2} (ref >59)
Globulin, Total: 2.6 g/dL (ref 1.5–4.5)
Glucose: 156 mg/dL — ABNORMAL HIGH (ref 65–99)
Potassium: 4.2 mmol/L (ref 3.5–5.2)
Sodium: 140 mmol/L (ref 134–144)
Total Protein: 6.9 g/dL (ref 6.0–8.5)

## 2018-09-13 LAB — HEMOGLOBIN A1C
Est. average glucose Bld gHb Est-mCnc: 186 mg/dL
Hgb A1c MFr Bld: 8.1 % — ABNORMAL HIGH (ref 4.8–5.6)

## 2018-09-13 LAB — VITAMIN D 25 HYDROXY (VIT D DEFICIENCY, FRACTURES): Vit D, 25-Hydroxy: 53.7 ng/mL (ref 30.0–100.0)

## 2018-09-13 LAB — MICROALBUMIN / CREATININE URINE RATIO
Creatinine, Urine: 37.3 mg/dL
Microalb/Creat Ratio: <8 mg/g creat (ref 0–29)
Microalbumin, Urine: <3 ug/mL

## 2018-09-15 MED ORDER — METFORMIN HCL ER 500 MG PO TB24: 500.0000 mg | ORAL_TABLET | Freq: Two times a day (BID) | ORAL | 3 refills | Status: DC

## 2018-09-15 NOTE — Addendum Note (Signed)
Addended by: Clarene Reamer B on: 09/15/2018 02:24 PM   Modules accepted: Orders

## 2018-12-18 ENCOUNTER — Ambulatory Visit (INDEPENDENT_AMBULATORY_CARE_PROVIDER_SITE_OTHER): Payer: Managed Care, Other (non HMO) | Admitting: Family Medicine

## 2018-12-18 ENCOUNTER — Encounter: Payer: Self-pay | Admitting: Family Medicine

## 2018-12-18 VITALS — Ht 63.5 in | Wt 129.1 lb

## 2018-12-18 DIAGNOSIS — F430 Acute stress reaction: Secondary | ICD-10-CM | POA: Diagnosis not present

## 2018-12-18 DIAGNOSIS — E119 Type 2 diabetes mellitus without complications: Secondary | ICD-10-CM

## 2018-12-18 DIAGNOSIS — F411 Generalized anxiety disorder: Secondary | ICD-10-CM

## 2018-12-18 NOTE — Progress Notes (Signed)
Virtual Visit via Video Note  I connected with Elwin Sleight on 12/18/18 at  3:30 PM EDT by a video enabled telemedicine application and verified that I am speaking with the correct person using two identifiers.  Location: Patient: At her home Provider: [place], Kathe Becton NP student also present for call.    I discussed the limitations of evaluation and management by telemedicine and the availability of in person appointments. The patient expressed understanding and agreed to proceed.  History of Present Illness: Chief Complaint  Patient presents with  . Diabetes    No new complaints. BS in range - 120s  This is a 48 year old female who presents today for virtual visit for follow-up of diabetes mellitus type 2.  3 months ago she had hemoglobin A1c elevated to 8.1 from 6.7 6 months prior.  Her Glucophage xr 500 mg was increased from daily to twice daily.  She reports that she has been tolerating her medication increase without side effects.  She has been checking her blood sugars regularly and they are running in the low 100s, mostly in the 120s. She works at [employer] and struggled at the beginning of the pandemic with anxiety.  She reports that work is going okay, she does not feel that they are very good about social distancing.  She reports that she always wears her mask and no one in her area has had the virus that she knows of.  She feels that she is coping well.    Observations/Objective: Patient is alert and answers questions appropriately.  Visible skin is unremarkable.  She is normally conversive without shortness of breath, audible wheeze or witnessed cough.  Mood and affect are appropriate. Ht 5' 3.5" (1.613 m)   Wt 129 lb 1.9 oz (58.6 kg)   BMI 22.51 kg/m  Wt Readings from Last 3 Encounters:  12/18/18 129 lb 1.9 oz (58.6 kg)  02/25/18 129 lb 1.9 oz (58.6 kg)  08/22/17 130 lb (59 kg)    Assessment and Plan: 1. Type 2 diabetes mellitus without complication,  without long-term current use of insulin (HCC) -Improved home readings on increased dose of metformin xr -Follow-up in 3 months for annual exam and labs  2. Anxiety as acute reaction to exceptional stress -Improved per patient, will recheck at follow-up visit with GAD 7   Clarene Reamer, FNP-BC  [facility] at [facility], [place]  12/21/2018 7:50 AM   Follow Up Instructions:    I discussed the assessment and treatment plan with the patient. The patient was provided an opportunity to ask questions and all were answered. The patient agreed with the plan and demonstrated an understanding of the instructions.   The patient was advised to call back or seek an in-person evaluation if the symptoms worsen or if the condition fails to improve as anticipated.   Elby Beck, FNP

## 2018-12-21 ENCOUNTER — Encounter: Payer: Self-pay | Admitting: Family Medicine

## 2018-12-23 ENCOUNTER — Other Ambulatory Visit: Payer: Self-pay

## 2018-12-23 ENCOUNTER — Ambulatory Visit: Payer: Managed Care, Other (non HMO) | Admitting: Podiatry

## 2018-12-23 ENCOUNTER — Encounter: Payer: Self-pay | Admitting: Podiatry

## 2018-12-23 DIAGNOSIS — L603 Nail dystrophy: Secondary | ICD-10-CM | POA: Diagnosis not present

## 2018-12-23 NOTE — Progress Notes (Signed)
  Subjective:  Patient ID: Lori Beasley, female    DOB: 1970/05/30,  MRN: [id number] HPI Chief Complaint  Patient presents with  . Nail Problem    Hallux bilateral - toenail thick and discolored in areas x few months, injury to left before, not sore, no treatment  . New Patient (Initial Visit)    48 y.o. female presents with the above complaint.   ROS: Denies fever chills nausea vomiting muscle aches pains calf pain back pain chest pain shortness of breath.  Past Medical History:  Diagnosis Date  . Diabetes ([place])   . Hypertension    No past surgical history on file.  Current Outpatient Medications:  .  amLODipine (NORVASC) 5 MG tablet, Take 1 tablet (5 mg total) by mouth daily., Disp: 90 tablet, Rfl: 3 .  Blood Glucose Monitoring Suppl (FIFTY50 GLUCOSE METER 2.0) w/Device KIT, Frequency:PHARMDIR   Dosage:0.0     Instructions:  Note:To check blood sugar twice daily. ICD9 code 250.00 Dose: 1, Disp: , Rfl:  .  glucose blood (BAYER CONTOUR TEST) test strip, Frequency:QD   Dosage:0.0     Instructions:  Note:Dose: 1, Disp: , Rfl:  .  Lancets MISC, Frequency:QD   Dosage:0.0     Instructions:  Note:Dose: 1, Disp: , Rfl:  .  lisinopril (ZESTRIL) 40 MG tablet, Take 1 tablet (40 mg total) by mouth daily., Disp: 90 tablet, Rfl: 3 .  metFORMIN (GLUCOPHAGE-XR) 500 MG 24 hr tablet, Take 1 tablet (500 mg total) by mouth 2 (two) times daily with a meal., Disp: 180 tablet, Rfl: 3 .  pravastatin (PRAVACHOL) 40 MG tablet, Take 1 tablet (40 mg total) by mouth daily., Disp: 90 tablet, Rfl: 3 .  SPRINTEC 28 0.25-35 MG-MCG tablet, TAKE 1 TABLET BY MOUTH  DAILY, Disp: 84 tablet, Rfl: 0 .  valACYclovir (VALTREX) 500 MG tablet, Take 1 tablet (500 mg total) by mouth daily., Disp: 90 tablet, Rfl: 1  No Known Allergies Review of Systems Objective:  There were no vitals filed for this visit.  General: Well developed, nourished, in no acute distress, alert and oriented x3   Dermatological: Skin is warm,  dry and supple bilateral. Nails x 10 are well maintained; remaining integument appears unremarkable at this time. There are no open sores, no preulcerative lesions, no rash or signs of infection present.  The distal lateral quarter of the toenail is darkened and yellowed this is bilaterally symmetrical most likely biomechanical.  He also has a transverse ridge just to the level of the central line of demarcation.  Vascular: Dorsalis Pedis artery and Posterior Tibial artery pedal pulses are 2/4 bilateral with immedate capillary fill time. Pedal hair growth present. No varicosities and no lower extremity edema present bilateral.   Neruologic: Grossly intact via light touch bilateral. Vibratory intact via tuning fork bilateral. Protective threshold with Semmes Wienstein monofilament intact to all pedal sites bilateral. Patellar and Achilles deep tendon reflexes 2+ bilateral. No Babinski or clonus noted bilateral.   Musculoskeletal: No gross boney pedal deformities bilateral. No pain, crepitus, or limitation noted with foot and ankle range of motion bilateral. Muscular strength 5/5 in all groups tested bilateral.  Gait: Unassisted, Nonantalgic.    Radiographs:  None taken  Assessment & Plan:   Assessment: Nail dystrophy cannot rule out onychomycosis  Plan: Samples of the skin and nail were taken today for pathologic evaluation.     Chibuikem Thang T. [place], [place]

## 2019-01-29 ENCOUNTER — Ambulatory Visit (INDEPENDENT_AMBULATORY_CARE_PROVIDER_SITE_OTHER): Payer: Managed Care, Other (non HMO) | Admitting: Podiatry

## 2019-01-29 ENCOUNTER — Other Ambulatory Visit: Payer: Self-pay

## 2019-01-29 DIAGNOSIS — L603 Nail dystrophy: Secondary | ICD-10-CM

## 2019-01-29 DIAGNOSIS — Z79899 Other long term (current) drug therapy: Secondary | ICD-10-CM | POA: Diagnosis not present

## 2019-01-29 MED ORDER — TERBINAFINE HCL 250 MG PO TABS: 250.0000 mg | ORAL_TABLET | Freq: Every day | ORAL | 0 refills | Status: DC

## 2019-01-29 NOTE — Patient Instructions (Signed)
Terbinafine tablets What is this medicine? TERBINAFINE (TER bin a feen) is an antifungal medicine. It is used to treat certain kinds of fungal or yeast infections. This medicine may be used for other purposes; ask your health care provider or pharmacist if you have questions. COMMON BRAND NAME(S): Lamisil, Terbinex What should I tell my health care provider before I take this medicine? They need to know if you have any of these conditions:  drink alcoholic beverages  kidney disease  liver disease  an unusual or allergic reaction to terbinafine, other medicines, foods, dyes, or preservatives  pregnant or trying to get pregnant  breast-feeding How should I use this medicine? Take this medicine by mouth with a full glass of water. Follow the directions on the prescription label. You can take this medicine with food or on an empty stomach. Take your medicine at regular intervals. Do not take your medicine more often than directed. Do not skip doses or stop your medicine early even if you feel better. Do not stop taking except on your doctor's advice. Talk to your pediatrician regarding the use of this medicine in children. Special care may be needed. Overdosage: If you think you have taken too much of this medicine contact a poison control center or emergency room at once. NOTE: This medicine is only for you. Do not share this medicine with others. What if I miss a dose? If you miss a dose, take it as soon as you can. If it is almost time for your next dose, take only that dose. Do not take double or extra doses. What may interact with this medicine? Do not take this medicine with any of the following medications:  thioridazine This medicine may also interact with the following medications:  beta-blockers  caffeine  cimetidine  cyclosporine  medicines for depression, anxiety, or psychotic disturbances  medicines for fungal infections like fluconazole and ketoconazole  medicines  for irregular heartbeat like amiodarone, flecainide and propafenone  rifampin  warfarin This list may not describe all possible interactions. Give your health care provider a list of all the medicines, herbs, non-prescription drugs, or dietary supplements you use. Also tell them if you smoke, drink alcohol, or use illegal drugs. Some items may interact with your medicine. What should I watch for while using this medicine? Visit your doctor or health care provider regularly. Tell your doctor right away if you have nausea or vomiting, loss of appetite, stomach pain on your right upper side, yellow skin, dark urine, light stools, or are over tired. Some fungal infections need many weeks or months of treatment to cure. If you are taking this medicine for a long time, you will need to have important blood work done. This medicine may cause serious skin reactions. They can happen weeks to months after starting the medicine. Contact your health care provider right away if you notice fevers or flu-like symptoms with a rash. The rash may be red or purple and then turn into blisters or peeling of the skin. Or, you might notice a red rash with swelling of the face, lips or lymph nodes in your neck or under your arms. What side effects may I notice from receiving this medicine? Side effects that you should report to your doctor or health care professional as soon as possible:  allergic reactions like skin rash or hives, swelling of the face, lips, or tongue  changes in vision  dark urine  fever or infection  general ill feeling or flu-like symptoms    light-colored stools  loss of appetite, nausea  rash, fever, and swollen lymph nodes  redness, blistering, peeling or loosening of the skin, including inside the mouth  right upper belly pain  unusually weak or tired  yellowing of the eyes or skin Side effects that usually do not require medical attention (report to your doctor or health care  professional if they continue or are bothersome):  changes in taste  diarrhea  hair loss  muscle or joint pain  stomach gas  stomach upset This list may not describe all possible side effects. Call your doctor for medical advice about side effects. You may report side effects to FDA at 1-800-FDA-1088. Where should I keep my medicine? Keep out of the reach of children. Store at room temperature below 25 degrees C (77 degrees F). Protect from light. Throw away any unused medicine after the expiration date. NOTE: This sheet is a summary. It may not cover all possible information. If you have questions about this medicine, talk to your doctor, pharmacist, or health care provider.  2020 Elsevier/Gold Standard (2018-06-07 15:37:07)  

## 2019-01-29 NOTE — Progress Notes (Signed)
She presents today for pathology results.  Objective: Vital signs are stable she is alert and oriented x3 pathology report does demonstrate nail dystrophy as well as septated hyphae such as saprophytic fungus.  No fungus could be specifically identified with PCR.  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  Plan: Discussed etiology pathology conservative versus surgical therapies at this point we discussed oral therapy we will go ahead and request liver profile as a baseline start her on first 30 days of Lamisil 250 mg tablets 1 p.o. daily and I will follow-up with her in 1 month.  We discussed the pros and cons of use of this oral medications and the risks involved she understands and is amenable to it.

## 2019-02-06 LAB — HEPATIC FUNCTION PANEL
ALT: 16 IU/L (ref 0–32)
AST: 16 IU/L (ref 0–40)
Albumin: 4.2 g/dL (ref 3.8–4.8)
Alkaline Phosphatase: 75 IU/L (ref 39–117)
Bilirubin Total: 0.2 mg/dL (ref 0.0–1.2)
Bilirubin, Direct: 0.1 mg/dL (ref 0.00–0.40)
Total Protein: 6.8 g/dL (ref 6.0–8.5)

## 2019-02-12 ENCOUNTER — Telehealth: Payer: Self-pay

## 2019-02-12 NOTE — Telephone Encounter (Signed)
Patient notified of results via voice mail °

## 2019-02-12 NOTE — Telephone Encounter (Signed)
-----   Message from Garrel Ridgel, [place] sent at 02/12/2019  6:55 AM EST ----- Blood work looks perfect we will follow-up with her at next scheduled appointment.  She may continue medication.

## 2019-03-05 ENCOUNTER — Ambulatory Visit: Payer: Managed Care, Other (non HMO) | Admitting: Podiatry

## 2019-04-09 ENCOUNTER — Other Ambulatory Visit: Payer: Self-pay | Admitting: Family Medicine

## 2019-04-09 DIAGNOSIS — Z1231 Encounter for screening mammogram for malignant neoplasm of breast: Secondary | ICD-10-CM

## 2019-04-30 ENCOUNTER — Telehealth: Payer: Self-pay | Admitting: Podiatry

## 2019-04-30 NOTE — Telephone Encounter (Signed)
Pt. Needs a refill on medication. Patient uses [facility] on [address].

## 2019-04-30 NOTE — Telephone Encounter (Signed)
Called patient - she would like a refill on her terbinafine. I advised she would need an appt prior to any refills, bloodwork is required. Patient understood and will keep scheduled appt.

## 2019-05-21 ENCOUNTER — Encounter: Payer: Self-pay | Admitting: Podiatry

## 2019-05-21 ENCOUNTER — Ambulatory Visit: Payer: Managed Care, Other (non HMO) | Admitting: Podiatry

## 2019-05-21 ENCOUNTER — Other Ambulatory Visit: Payer: Self-pay

## 2019-05-21 VITALS — Temp 98.4°F

## 2019-05-21 DIAGNOSIS — Z79899 Other long term (current) drug therapy: Secondary | ICD-10-CM

## 2019-05-21 MED ORDER — TERBINAFINE HCL 250 MG PO TABS: 250.0000 mg | ORAL_TABLET | Freq: Every day | ORAL | 0 refills | Status: DC

## 2019-05-21 NOTE — Progress Notes (Signed)
She presents today for follow-up of her nail fungus she is completed her first 30 days of Lamisil and has just done so because of a delay at the pharmacy.  She denies fever chills nausea vomiting muscle aches pains denies any new medications she denies any itching or rashes.  Objective: No change in her nail plate as of yet.  Pulses remain palpable.  Assessment: Onychomycosis long-term therapy with Lamisil.  Plan: Go ahead and got her started on her 90-day dose of Lamisil today and send her out for blood work she got liver profile come back abnormal we will notify her immediately.  Otherwise I will follow-up with her in 4 months

## 2019-05-22 LAB — HEPATIC FUNCTION PANEL
ALT: 16 IU/L (ref 0–32)
AST: 15 IU/L (ref 0–40)
Albumin: 4.6 g/dL (ref 3.8–4.8)
Alkaline Phosphatase: 81 IU/L (ref 39–117)
Bilirubin Total: <0.2 mg/dL (ref 0.0–1.2)
Bilirubin, Direct: 0.06 mg/dL (ref 0.00–0.40)
Total Protein: 7 g/dL (ref 6.0–8.5)

## 2019-05-29 ENCOUNTER — Telehealth: Payer: Self-pay

## 2019-05-29 NOTE — Telephone Encounter (Signed)
-----   Message from Marissa Nestle, RN sent at 05/23/2019  8:07 AM EST ----- Angie, please assist pt. Joya San ----- Message ----- From: Elinor Parkinson, [place] Sent: 05/22/2019  12:33 PM EST To: Marissa Nestle, RN  Blood work looks perfect you may continue to take medication.

## 2019-05-29 NOTE — Telephone Encounter (Signed)
Patient informed of normal lab results and instructed to continue medication per Dr. Al Corpus

## 2019-06-09 ENCOUNTER — Ambulatory Visit
Admission: RE | Admit: 2019-06-09 | Discharge: 2019-06-09 | Disposition: A | Discharge status: Home / Self Care | Payer: Managed Care, Other (non HMO) | Source: Ambulatory Visit | Attending: Family Medicine | Admitting: Family Medicine

## 2019-06-09 DIAGNOSIS — Z1231 Encounter for screening mammogram for malignant neoplasm of breast: Secondary | ICD-10-CM | POA: Insufficient documentation

## 2019-06-24 ENCOUNTER — Other Ambulatory Visit: Payer: Self-pay | Admitting: Family Medicine

## 2019-06-24 DIAGNOSIS — I10 Essential (primary) hypertension: Secondary | ICD-10-CM

## 2019-06-24 DIAGNOSIS — E119 Type 2 diabetes mellitus without complications: Secondary | ICD-10-CM

## 2019-07-10 ENCOUNTER — Other Ambulatory Visit: Payer: Self-pay | Admitting: Family Medicine

## 2019-07-10 DIAGNOSIS — E119 Type 2 diabetes mellitus without complications: Secondary | ICD-10-CM

## 2019-07-10 DIAGNOSIS — I10 Essential (primary) hypertension: Secondary | ICD-10-CM

## 2019-07-21 ENCOUNTER — Telehealth: Payer: Self-pay

## 2019-07-21 MED ORDER — TERBINAFINE HCL 250 MG PO TABS: 250.0000 mg | ORAL_TABLET | Freq: Every day | ORAL | 0 refills | Status: DC

## 2019-07-21 NOTE — Telephone Encounter (Signed)
Patient called requesting the last refill of Terbinafine be sent to [facility].  Per Dr Al Corpus, ok to send to [facility]     Script has been sent and patient has been notified

## 2019-07-26 ENCOUNTER — Other Ambulatory Visit: Payer: Self-pay | Admitting: Family Medicine

## 2019-07-26 DIAGNOSIS — E119 Type 2 diabetes mellitus without complications: Secondary | ICD-10-CM

## 2019-08-20 IMAGING — MG DIGITAL SCREENING BILATERAL MAMMOGRAM WITH TOMO AND CAD
8 series · 8 of 24 positions shown · non-contrast
Comparison: Previous exam(s).

CLINICAL DATA: Screening.

EXAM:
DIGITAL SCREENING BILATERAL MAMMOGRAM WITH TOMO AND CAD

[L MLO synth-2D]
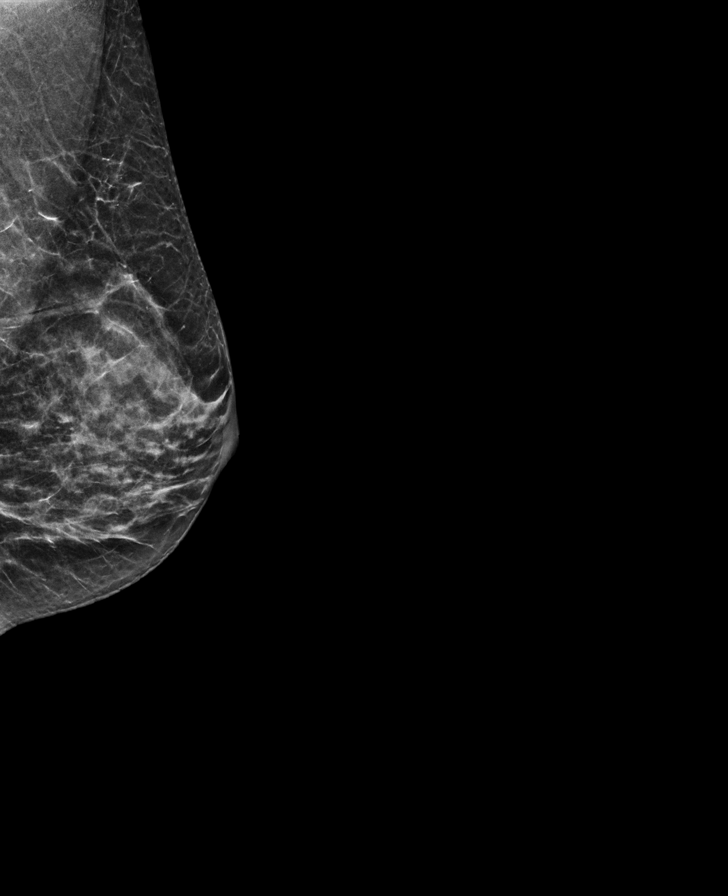

[R CC synth-2D]
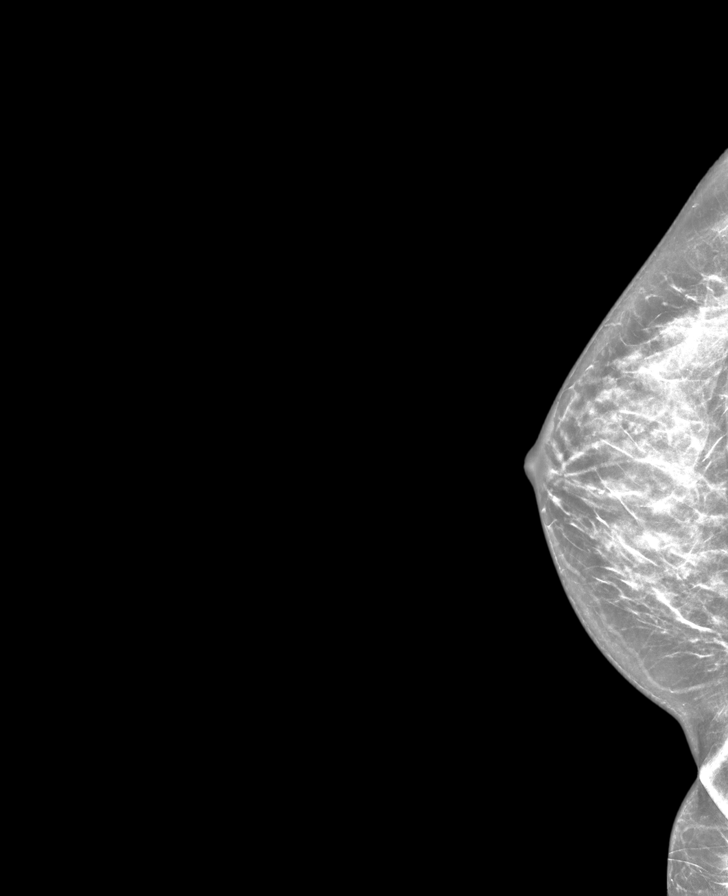

[L CC synth-2D]
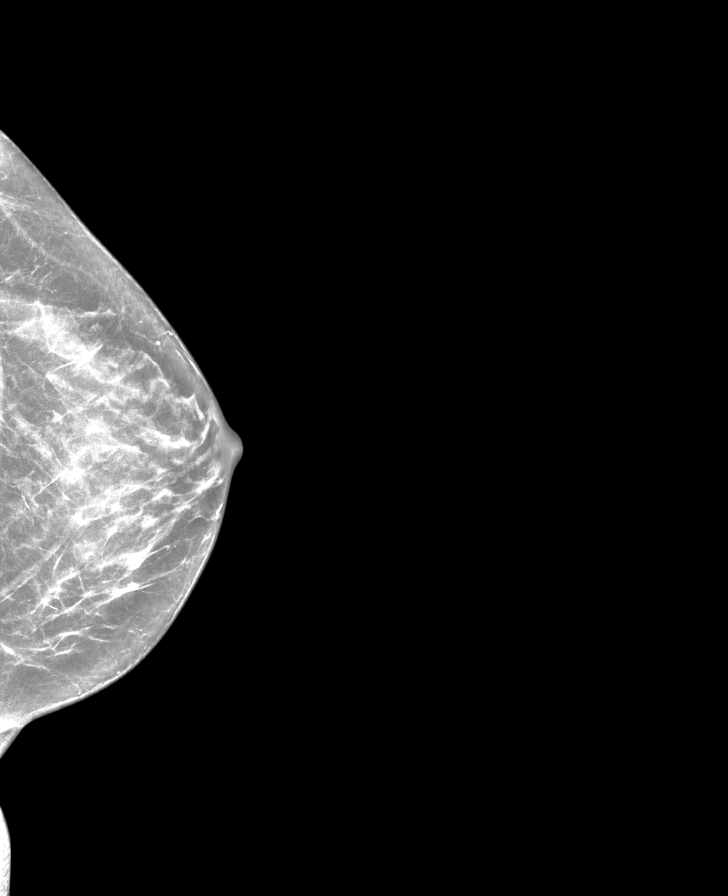

[R MLO synth-2D]
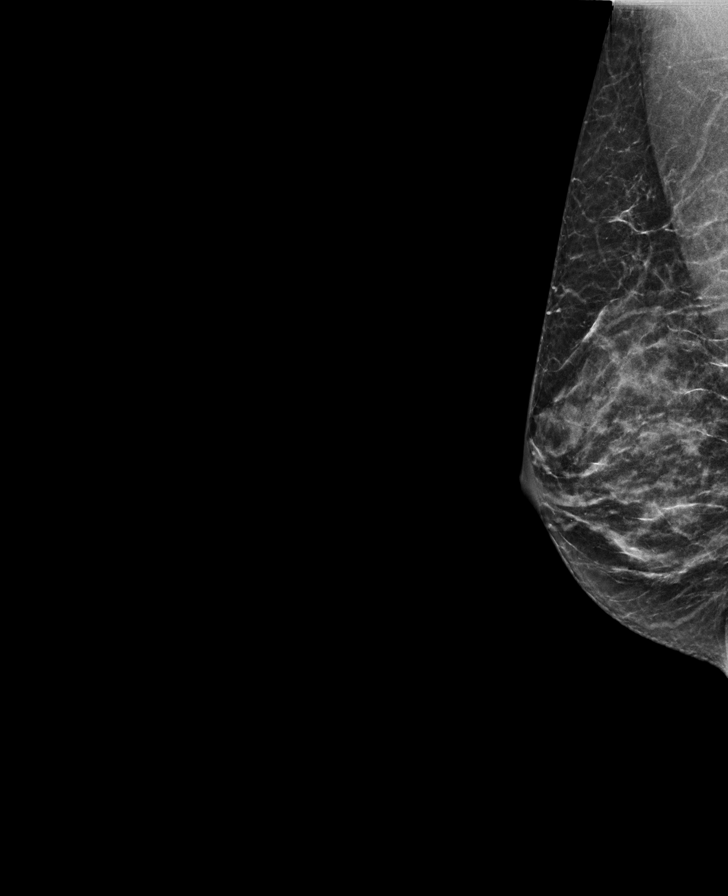

[L MLO tomo · tomo slice 27/53.0]
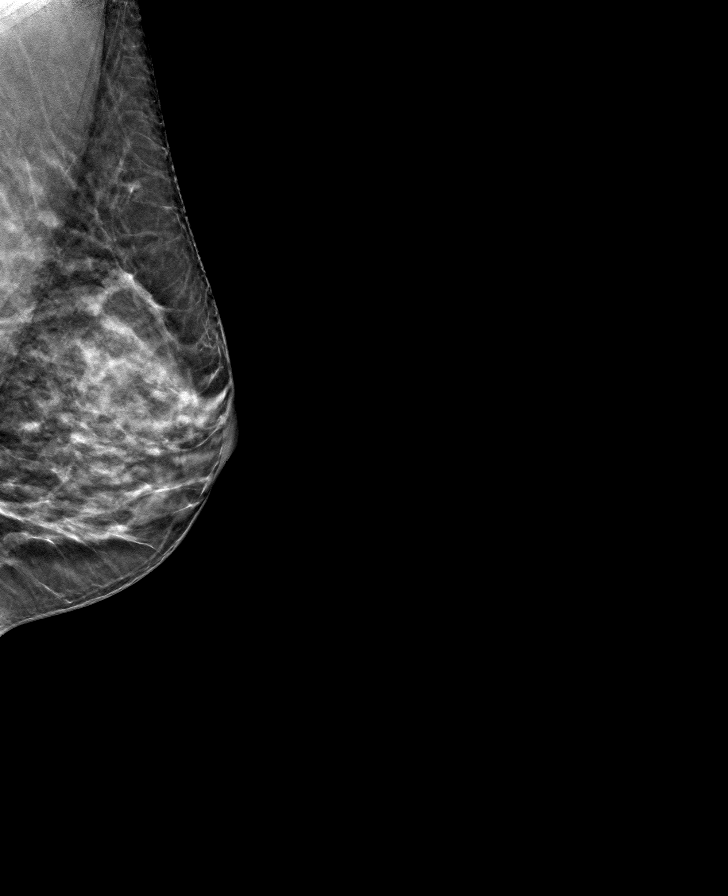

[L CC tomo · tomo slice 31/62.0]
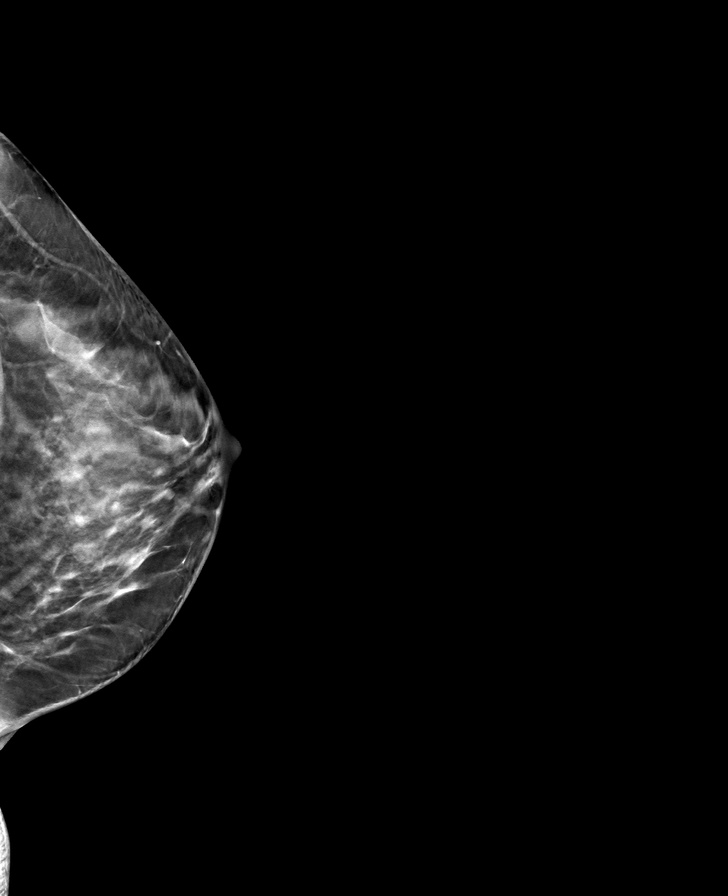

[R CC tomo · tomo slice 33/64.0]
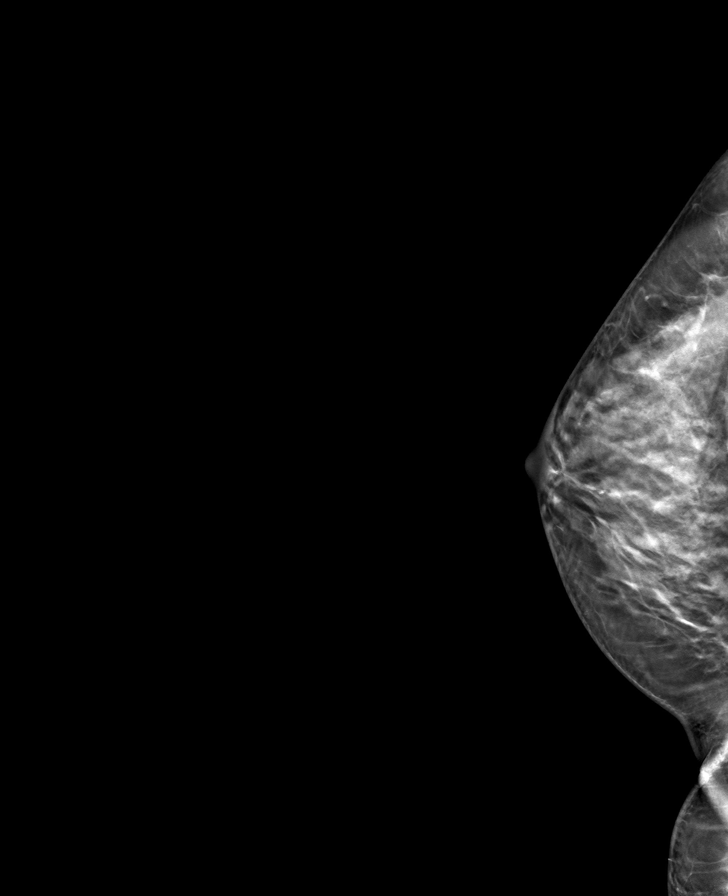

[R MLO tomo · tomo slice 28/55.0]
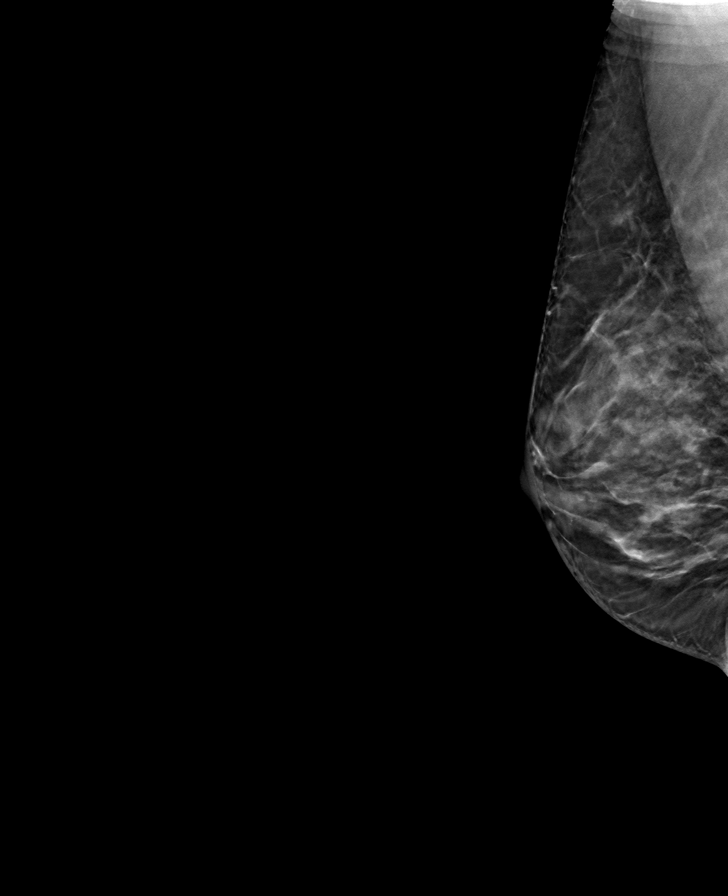

[8 of 24 positions shown; findings below may reference images not displayed]

ACR Breast Density Category d: The breast tissue is extremely dense,
which lowers the sensitivity of mammography
FINDINGS: There are no findings suspicious for malignancy. Images were
processed with CAD.
IMPRESSION: No mammographic evidence of malignancy. A result letter of this
screening mammogram will be mailed directly to the patient.

RECOMMENDATION:
Screening mammogram in one year. (Code:[id number])

BI-RADS CATEGORY  1: Negative.

## 2019-09-01 ENCOUNTER — Other Ambulatory Visit: Payer: Self-pay | Admitting: Family Medicine

## 2019-09-01 DIAGNOSIS — E119 Type 2 diabetes mellitus without complications: Secondary | ICD-10-CM

## 2019-09-01 DIAGNOSIS — I10 Essential (primary) hypertension: Secondary | ICD-10-CM

## 2019-09-08 ENCOUNTER — Other Ambulatory Visit: Payer: Self-pay | Admitting: Family Medicine

## 2019-09-08 DIAGNOSIS — E119 Type 2 diabetes mellitus without complications: Secondary | ICD-10-CM

## 2019-09-08 DIAGNOSIS — I10 Essential (primary) hypertension: Secondary | ICD-10-CM

## 2019-09-08 DIAGNOSIS — E782 Mixed hyperlipidemia: Secondary | ICD-10-CM

## 2019-09-11 ENCOUNTER — Other Ambulatory Visit (INDEPENDENT_AMBULATORY_CARE_PROVIDER_SITE_OTHER): Payer: Managed Care, Other (non HMO)

## 2019-09-11 DIAGNOSIS — E119 Type 2 diabetes mellitus without complications: Secondary | ICD-10-CM

## 2019-09-11 DIAGNOSIS — E782 Mixed hyperlipidemia: Secondary | ICD-10-CM

## 2019-09-11 DIAGNOSIS — I10 Essential (primary) hypertension: Secondary | ICD-10-CM

## 2019-09-12 LAB — BASIC METABOLIC PANEL
BUN/Creatinine Ratio: 16 (ref 9–23)
BUN: 14 mg/dL (ref 6–24)
CO2: 23 mmol/L (ref 20–29)
Calcium: 9.5 mg/dL (ref 8.7–10.2)
Chloride: 97 mmol/L (ref 96–106)
Creatinine, Ser: 0.86 mg/dL (ref 0.57–1.00)
GFR calc Af Amer: 91 mL/min/{1.73_m2} (ref >59)
GFR calc non Af Amer: 79 mL/min/{1.73_m2} (ref >59)
Glucose: 170 mg/dL — ABNORMAL HIGH (ref 65–99)
Potassium: 4.3 mmol/L (ref 3.5–5.2)
Sodium: 134 mmol/L (ref 134–144)

## 2019-09-12 LAB — CBC WITH DIFFERENTIAL/PLATELET
Basophils Absolute: 0.1 10*3/uL (ref 0.0–0.2)
Basos: 1 %
EOS (ABSOLUTE): 0.1 10*3/uL (ref 0.0–0.4)
Eos: 2 %
Hematocrit: 39.3 % (ref 34.0–46.6)
Hemoglobin: 13.3 g/dL (ref 11.1–15.9)
Immature Grans (Abs): 0 10*3/uL (ref 0.0–0.1)
Immature Granulocytes: 0 %
Lymphocytes Absolute: 2.9 10*3/uL (ref 0.7–3.1)
Lymphs: 45 %
MCH: 30 pg (ref 26.6–33.0)
MCHC: 33.8 g/dL (ref 31.5–35.7)
MCV: 89 fL (ref 79–97)
Monocytes Absolute: 0.5 10*3/uL (ref 0.1–0.9)
Monocytes: 8 %
Neutrophils Absolute: 2.9 10*3/uL (ref 1.4–7.0)
Neutrophils: 44 %
Platelets: 322 10*3/uL (ref 150–450)
RBC: 4.43 x10E6/uL (ref 3.77–5.28)
RDW: 12.6 % (ref 11.7–15.4)
WBC: 6.6 10*3/uL (ref 3.4–10.8)

## 2019-09-12 LAB — LIPID PANEL
Chol/HDL Ratio: 2.3 ratio (ref 0.0–4.4)
Cholesterol, Total: 188 mg/dL (ref 100–199)
HDL: 82 mg/dL (ref >39)
LDL Chol Calc (NIH): 94 mg/dL (ref 0–99)
Triglycerides: 64 mg/dL (ref 0–149)
VLDL Cholesterol Cal: 12 mg/dL (ref 5–40)

## 2019-09-12 LAB — HEMOGLOBIN A1C
Est. average glucose Bld gHb Est-mCnc: 252 mg/dL
Hgb A1c MFr Bld: 10.4 % — ABNORMAL HIGH (ref 4.8–5.6)

## 2019-09-12 LAB — TSH: TSH: 0.832 u[IU]/mL (ref 0.450–4.500)

## 2019-09-12 LAB — MICROALBUMIN / CREATININE URINE RATIO
Creatinine, Urine: 12.2 mg/dL
Microalb/Creat Ratio: <25 mg/g creat (ref 0–29)
Microalbumin, Urine: <3 ug/mL

## 2019-09-19 ENCOUNTER — Encounter: Payer: Managed Care, Other (non HMO) | Admitting: Family Medicine

## 2019-09-19 ENCOUNTER — Other Ambulatory Visit: Payer: Self-pay

## 2019-09-23 ENCOUNTER — Other Ambulatory Visit: Payer: Self-pay | Admitting: Family Medicine

## 2019-09-23 DIAGNOSIS — B009 Herpesviral infection, unspecified: Secondary | ICD-10-CM

## 2019-10-01 ENCOUNTER — Ambulatory Visit (INDEPENDENT_AMBULATORY_CARE_PROVIDER_SITE_OTHER): Payer: Managed Care, Other (non HMO) | Admitting: Podiatry

## 2019-10-01 ENCOUNTER — Other Ambulatory Visit: Payer: Self-pay

## 2019-10-01 ENCOUNTER — Encounter: Payer: Self-pay | Admitting: Podiatry

## 2019-10-01 DIAGNOSIS — L603 Nail dystrophy: Secondary | ICD-10-CM | POA: Diagnosis not present

## 2019-10-01 MED ORDER — TERBINAFINE HCL 250 MG PO TABS: 250.0000 mg | ORAL_TABLET | Freq: Every day | ORAL | 0 refills | Status: DC

## 2019-10-01 NOTE — Progress Notes (Signed)
Currently she has completed her Lamisil therapy states that she is doing very well at this point has noticed some clearing.  She denies fever chills nausea vomiting muscle aches pains calf pain back pain chest pain shortness of breath or rashes.  Objective: Nails appear to be growing out by approximately 20 to 30% particularly the hallux left.  Assessment: Resolving onychomycosis long-term therapy of Lamisil.  Plan: At this point I would like to go ahead and continue Lamisil therapy 1 tablet every other day for the next 60 days.  We dispensed 30 tablets and will follow up with her in 3 months.

## 2019-10-01 NOTE — Patient Instructions (Signed)
Dr. Hyatt has sent over a refill for Lamisil to your pharmacy today. The instructions on your bottle will say "take 1 tablet daily", however, he would like for you to take one pill every other day. He will follow up with you in 3 months to re-evaluate your toenails. 

## 2019-10-29 ENCOUNTER — Ambulatory Visit (INDEPENDENT_AMBULATORY_CARE_PROVIDER_SITE_OTHER): Payer: Managed Care, Other (non HMO) | Admitting: Family Medicine

## 2019-10-29 ENCOUNTER — Other Ambulatory Visit: Payer: Self-pay

## 2019-10-29 ENCOUNTER — Encounter: Payer: Self-pay | Admitting: Family Medicine

## 2019-10-29 VITALS — BP 128/70 | HR 78 | Ht 63.5 in | Wt 127.0 lb

## 2019-10-29 DIAGNOSIS — E782 Mixed hyperlipidemia: Secondary | ICD-10-CM

## 2019-10-29 DIAGNOSIS — Z1211 Encounter for screening for malignant neoplasm of colon: Secondary | ICD-10-CM | POA: Diagnosis not present

## 2019-10-29 DIAGNOSIS — E119 Type 2 diabetes mellitus without complications: Secondary | ICD-10-CM

## 2019-10-29 DIAGNOSIS — Z0000 Encounter for general adult medical examination without abnormal findings: Secondary | ICD-10-CM

## 2019-10-29 MED ORDER — GLIPIZIDE 10 MG PO TABS: 10.0000 mg | ORAL_TABLET | Freq: Two times a day (BID) | ORAL | 3 refills | Status: DC

## 2019-10-29 MED ORDER — ROSUVASTATIN CALCIUM 10 MG PO TABS: 10.0000 mg | ORAL_TABLET | Freq: Every day | ORAL | 3 refills | Status: DC

## 2019-10-29 NOTE — Progress Notes (Signed)
Subjective:    Patient ID: Lori Beasley, female    DOB: 17-Dec-1970, 49 y.o.   MRN: [id number]  HPI Chief Complaint  Patient presents with  . Annual Exam    physical , pt has no other concerns      Last CPE- Mammo- 06/09/2019 Pap-02/25/2018 negative, negative HPV Colonoscopy- ok to be referred for screening Tdap- 02/07/2010 Flu- annual Covid 19 vaccine- fully vaccinated Exercise-not recently Stress- up and down   DM type 2- checking blood sugars a couple of times a week, not walking as much, making poor food choices. Eats out a lot. Occasional Gatorade, occasional juice, no soda/ sweet tea. Eating 3 meals a day. Breakfast- instant oatmeal, 2 packs, milk. Lunch- sandwich, chips, water. Dinner- out to eat, carry out food. Snacks- chips Has been only taking 1 metformin xr daily for most of the time since last visit. Causes occasional stomach upset and diarrhea.   Valacyclovir- outbreaks with stress. A couple a year.   Review of Systems  Constitutional: Positive for fatigue (occasional).  HENT: Negative.   Eyes: Negative.   Respiratory: Negative.   Cardiovascular: Negative.   Gastrointestinal: Positive for diarrhea (occasional with metformin).  Endocrine: Negative.   Genitourinary: Negative.   Musculoskeletal: Negative.   Skin: Negative.   Allergic/Immunologic: Negative.   Neurological: Negative.   Hematological: Negative.   Psychiatric/Behavioral: Negative.        Objective:   Physical Exam Physical Exam  Constitutional: She is oriented to person, place, and time. She appears well-developed and well-nourished. No distress.  HENT:  Head: Normocephalic and atraumatic.  Right Ear: External ear normal. TM normal.  Left Ear: External ear normal. TM normal.  Nose: Nose normal.  Mouth/Throat: Oropharynx is clear and moist. No oropharyngeal exudate.  Eyes: Conjunctivae are normal.   Neck: Normal range of motion. Neck supple. No JVD present. No thyromegaly present.    Cardiovascular: Normal rate, regular rhythm, normal heart sounds and intact distal pulses.   Pulmonary/Chest: Effort normal and breath sounds normal. Right breast exhibits no inverted nipple, no mass, no nipple discharge, no skin change and no tenderness. Left breast exhibits no inverted nipple, no mass, no nipple discharge, no skin change and no tenderness. Breasts are symmetrical.  Abdominal: Soft. Bowel sounds are normal. She exhibits no distension and no mass. There is no tenderness. There is no rebound and no guarding.  Musculoskeletal: Normal range of motion. She exhibits no edema or tenderness.  Lymphadenopathy:    She has no cervical adenopathy.  Neurological: She is alert and oriented to person, place, and time.   Skin: Skin is warm and dry. She is not diaphoretic.  Psychiatric: She has a normal mood and affect. Her behavior is normal. Judgment and thought content normal.  Vitals reviewed.    BP 128/70 (BP Location: Left Arm, Patient Position: Sitting)   Pulse 78   Ht 5' 3.5" (1.613 m)   Wt 127 lb (57.6 kg)   SpO2 99%   BMI 22.14 kg/m  Wt Readings from Last 3 Encounters:  10/29/19 127 lb (57.6 kg)  12/18/18 129 lb 1.9 oz (58.6 kg)  02/25/18 129 lb 1.9 oz (58.6 kg)   Depression screen [facility] 2/9 10/29/2019 08/22/2017  Decreased Interest 0 0  Down, Depressed, Hopeless 0 0  PHQ - 2 Score 0 0        Assessment & Plan:  1. Annual physical exam - Discussed and encouraged healthy lifestyle choices- adequate sleep, regular exercise, stress management and healthy  food choices.    2. Type 2 diabetes mellitus without complication, without long-term current use of insulin (HCC) - last Hgba1c 09/11/2019- 10.4, up from 8.1 09/2018.  - discussed importance of adequate blood sugar control, need to change diet, provided written and verbal instructions for low carb diet.  - if unable to get blood sugars to come down, will need to consider inadequate insulin production and do additional work  up - glipiZIDE (GLUCOTROL) 10 MG tablet; Take 1 tablet (10 mg total) by mouth 2 (two) times daily before a meal.  Dispense: 60 tablet; Refill: 3 - follow up in 8 weeks  3. Mixed hyperlipidemia - stop pravastatin, start rosuvastatin - rosuvastatin (CRESTOR) 10 MG tablet; Take 1 tablet (10 mg total) by mouth daily.  Dispense: 90 tablet; Refill: 3  4. Screening for colon cancer - Ambulatory referral to Gastroenterology  This visit occurred during the SARS-CoV-2 public health emergency.  Safety protocols were in place, including screening questions prior to the visit, additional usage of staff PPE, and extensive cleaning of exam room while observing appropriate contact time as indicated for disinfecting solutions.    Olean Ree, FNP-BC  [facility], [facility]  10/29/2019 4:21 PM

## 2019-10-29 NOTE — Patient Instructions (Addendum)
Follow up in 8 weeks.   Stop pravastatin, start rosuvastatin  Take glipizide with first meal of the day. Goal is to take two glucophage daily- can take twice a day or two at one time. Take with a meal.   For the next two weeks, please check your blood sugar readings twice a day- different times. Let me know if over 175 consistently. Bring your log to your visit.   Watch carbs very carefully, try to aim for less than 20 grams per meal for at least two weeks and see if blood sugars come down  There is not one right eating plan for everyone.  It may take trial and error to find what will work for you.  It is important to get adequate protein and fiber with your meals.  It is okay to not eat breakfast or to skip meals if you are not hungry.  Avoid snacking between meals.  Unless you are on a fluid restriction, drink 80 to 90 ounces of water a day.  Suggested resources- [employer]ySkies[employer]hu www.adaptyourlifeacademy.com-there is a quiz to help you determine how many carbohydrates you should eat a day  www.thefastingmethod.com  If you have diabetes or access to a blood sugar machine, I recommend you check your blood sugar daily and keep a log.  Vary the time you check your blood sugar such as fasting, before meal, 2 hours after a meal and at bedtime.  Look for trends with the foods you are eating and be a scientist of your body.  Here are some guidelines to help you with meal planning -  Avoid all processed and packaged foods (bread, pasta, crackers, chips, etc) and beverages containing calories.  Avoid added sugars and excessive natural sugars.  Pay attention to how you feel if you consume artificial sweeteners.  Do they make you more hungry or raise your blood sugar?  With every meal and snack, aim to get 20 g of protein (3 ounces of meat, 4 ounces of fish, 3 eggs, protein powder, 1 cup [place] yogurt, 1 cup cottage cheese, etc.)  Increase fiber in the form of non-starchy vegetables.  These  help you feel full with very little carbohydrates and are good for gut health.  Nonstarchy vegetables include summer squash, onions, peppers, tomatoes, eggplant, broccoli, cauliflower, cabbage, lettuce, spinach.  Have small amounts of good fats such as avocado, nuts, olive oil, nut butters, olives.  Add a little cheese to your meals to make them tasty.   Try to plan your meals for the week and do some meal preparation when able.  If possible, make lunches for the week ahead of time.  Plan a couple of dinners and make enough so you can have leftovers.  Build in a treat once a week.

## 2019-10-31 ENCOUNTER — Encounter: Payer: Self-pay | Admitting: Internal Medicine

## 2019-12-10 ENCOUNTER — Other Ambulatory Visit: Payer: Self-pay | Admitting: Family Medicine

## 2019-12-10 DIAGNOSIS — E119 Type 2 diabetes mellitus without complications: Secondary | ICD-10-CM

## 2019-12-29 ENCOUNTER — Other Ambulatory Visit: Payer: Self-pay

## 2019-12-29 ENCOUNTER — Ambulatory Visit: Payer: Managed Care, Other (non HMO) | Admitting: Family Medicine

## 2019-12-29 ENCOUNTER — Encounter: Payer: Self-pay | Admitting: Family Medicine

## 2019-12-29 VITALS — BP 124/86 | HR 93 | Wt 128.3 lb

## 2019-12-29 DIAGNOSIS — Z1211 Encounter for screening for malignant neoplasm of colon: Secondary | ICD-10-CM | POA: Diagnosis not present

## 2019-12-29 DIAGNOSIS — E119 Type 2 diabetes mellitus without complications: Secondary | ICD-10-CM | POA: Diagnosis not present

## 2019-12-29 DIAGNOSIS — R059 Cough, unspecified: Secondary | ICD-10-CM | POA: Diagnosis not present

## 2019-12-29 LAB — POCT GLYCOSYLATED HEMOGLOBIN (HGB A1C): Hemoglobin A1C: 7.9 % — AB (ref 4.0–5.6)

## 2019-12-29 NOTE — Progress Notes (Signed)
   Subjective:    Patient ID: Lenell Antu, female    DOB: 20-Sep-1970, 49 y.o.   MRN: [id number]  HPI Chief Complaint  Patient presents with  . Follow-up   This is a 49 yo female who presents today for follow up of DM type 2. At last visit, hgba1c elevated to 10.4. Glipizide 10 mg po bid was added. Home blood sugars running 70-140s. Rare low. Taking glipizide with food. Has really cut down on her carbs.   Hard stools, goes daily. Has tried dulcolax, MOM.   Cough for a couple of weeks. Picked up cold from her son. No fevers/ chills, shortness of breath, wheeze.  Taking over-the-counter Delsym with some relief.  Colon cancer screening-there was some confusion about location as I had put in referral for [facility] location.  Patient willing to go to endoscopy Center in [place], her daughter can take her as long as it is on a Friday.  Review of Systems Per HPI    Objective:   Physical Exam Physical Exam  Constitutional: Oriented to person, place, and time. Appears well-developed and well-nourished.  HENT:  Head: Normocephalic and atraumatic.  Eyes: Conjunctivae are normal.  Neck: Normal range of motion. Neck supple.  Cardiovascular: Normal rate, regular rhythm and normal heart sounds.   Pulmonary/Chest: Effort normal and breath sounds normal.  Musculoskeletal: No lower extremity edema.   Neurological: Alert and oriented to person, place, and time.  Skin: Skin is warm and dry.  Psychiatric: Normal mood and affect. Behavior is normal. Judgment and thought content normal.  Vitals reviewed.     BP 124/86   Pulse 93   Wt 128 lb 5 oz (58.2 kg)   SpO2 97%   BMI 22.37 kg/m  Wt Readings from Last 3 Encounters:  12/29/19 128 lb 5 oz (58.2 kg)  10/29/19 127 lb (57.6 kg)  12/18/18 129 lb 1.9 oz (58.6 kg)   Results for orders placed or performed in visit on 12/29/19  HgB A1c  Result Value Ref Range   Hemoglobin A1C 7.9 (A) 4.0 - 5.6 %   HbA1c POC (<> result, manual entry)      HbA1c, POC (prediabetic range)     HbA1c, POC (controlled diabetic range)         Assessment & Plan:  1. Type 2 diabetes mellitus without complication, without long-term current use of insulin (HCC) -Significantly improved A1c down from 10.4-7.9 -Continue current medication of phage XR 500 mg p.o. twice daily, glipizide 10 mg p.o. twice daily.  Monitor carefully for low blood sugars and titrate glipizide if needed. -Follow-up in 3 months - HgB A1c  2. Cough -Suspect either post viral or related to seasonal allergies.  She was not coughing in the office and her lungs were clear.  Discussed continuing over-the-counter cough suppressants, Mucinex, can add long-acting antihistamine.  Follow-up if worsening, no improvement, development of sputum, wheeze, fevers.  3. Screening for colon cancer - Ambulatory referral to Gastroenterology  This visit occurred during the SARS-CoV-2 public health emergency.  Safety protocols were in place, including screening questions prior to the visit, additional usage of staff PPE, and extensive cleaning of exam room while observing appropriate contact time as indicated for disinfecting solutions.    Olean Ree, FNP-BC  [facility], [facility]  12/30/2019 11:33 AM

## 2019-12-29 NOTE — Patient Instructions (Signed)
Can use Miralax for constipation- store brand is fine  I will re send your referral for your colonoscopy  Follow up in 3 months

## 2019-12-30 ENCOUNTER — Encounter: Payer: Self-pay | Admitting: Family Medicine

## 2020-01-02 ENCOUNTER — Encounter: Payer: Managed Care, Other (non HMO) | Admitting: Internal Medicine

## 2020-01-05 ENCOUNTER — Ambulatory Visit (INDEPENDENT_AMBULATORY_CARE_PROVIDER_SITE_OTHER): Payer: Managed Care, Other (non HMO) | Admitting: Podiatry

## 2020-01-05 ENCOUNTER — Other Ambulatory Visit: Payer: Self-pay

## 2020-01-05 ENCOUNTER — Encounter: Payer: Self-pay | Admitting: Podiatry

## 2020-01-05 DIAGNOSIS — L603 Nail dystrophy: Secondary | ICD-10-CM

## 2020-01-05 MED ORDER — TERBINAFINE HCL 250 MG PO TABS
250.0000 mg | ORAL_TABLET | Freq: Every day | ORAL | 0 refills | Status: DC
Start: 2020-01-05 — End: 2020-03-19

## 2020-01-05 NOTE — Progress Notes (Signed)
She presents today for follow-up of her Lamisil therapy.  She is completed 2 doses of the every other day and her nails are growing out really very well.  She denies fever chills nausea vomiting muscle aches pains calf pain back pain chest pain shortness of breath.  Objective: Vital signs are stable alert oriented x3 there is no erythema edema cellulitis drainage or odor.  Her toenails appear to be growing out very nicely I went ahead and debrided a couple of the nails for her today.  She is going to start on her next every other day dose of Lamisil and I will follow-up with her 3 months

## 2020-01-15 ENCOUNTER — Other Ambulatory Visit: Payer: Self-pay | Admitting: Family Medicine

## 2020-01-15 DIAGNOSIS — I10 Essential (primary) hypertension: Secondary | ICD-10-CM

## 2020-01-16 ENCOUNTER — Telehealth: Payer: Self-pay

## 2020-01-16 ENCOUNTER — Other Ambulatory Visit: Payer: Self-pay | Admitting: Family Medicine

## 2020-01-16 DIAGNOSIS — I10 Essential (primary) hypertension: Secondary | ICD-10-CM

## 2020-01-16 DIAGNOSIS — E119 Type 2 diabetes mellitus without complications: Secondary | ICD-10-CM

## 2020-01-16 MED ORDER — AMLODIPINE BESYLATE 5 MG PO TABS: 5.0000 mg | ORAL_TABLET | Freq: Every day | ORAL | 1 refills | Status: DC

## 2020-01-16 NOTE — Telephone Encounter (Signed)
Received fax from [facility] requesting refill of pravastatin and amlodipine. Pt reported not taking pravastatin on 12/29/19  Last OV 12/29/19 Next OV  04/02/20 Last fill   09/01/19, #90, 1 RF, for both   Sent in refills for amlodipine

## 2020-01-19 NOTE — Telephone Encounter (Signed)
Patient had been switched to Rosuvastatin. Denial appropriate.

## 2020-02-02 ENCOUNTER — Encounter: Payer: Self-pay | Admitting: Gastroenterology

## 2020-02-09 ENCOUNTER — Telehealth: Payer: Self-pay

## 2020-02-09 DIAGNOSIS — E119 Type 2 diabetes mellitus without complications: Secondary | ICD-10-CM

## 2020-02-09 MED ORDER — GLIPIZIDE 10 MG PO TABS: 10.0000 mg | ORAL_TABLET | Freq: Two times a day (BID) | ORAL | 5 refills | Status: DC

## 2020-02-09 NOTE — Telephone Encounter (Signed)
Rx sent electronically.  

## 2020-03-04 ENCOUNTER — Other Ambulatory Visit: Payer: Self-pay | Admitting: Family Medicine

## 2020-03-04 DIAGNOSIS — E119 Type 2 diabetes mellitus without complications: Secondary | ICD-10-CM

## 2020-03-08 NOTE — Telephone Encounter (Signed)
Pharmacy requests refill on: Metformin 500 mg 24 hr   LAST REFILL: 12/11/2019 (Q-180, R-0) LAST OV: 12/29/2019 NEXT OV: 04/02/2020 PHARMACY: [facility] [place], [place]  Hgb A1C (12/29/2019): 7.9

## 2020-03-18 ENCOUNTER — Other Ambulatory Visit: Payer: Managed Care, Other (non HMO)

## 2020-03-19 ENCOUNTER — Other Ambulatory Visit: Payer: Self-pay

## 2020-03-19 ENCOUNTER — Ambulatory Visit (AMBULATORY_SURGERY_CENTER): Payer: Managed Care, Other (non HMO) | Admitting: *Deleted

## 2020-03-19 VITALS — Ht 63.5 in | Wt 128.0 lb

## 2020-03-19 DIAGNOSIS — Z1211 Encounter for screening for malignant neoplasm of colon: Secondary | ICD-10-CM

## 2020-03-19 NOTE — Progress Notes (Signed)
No egg or soy allergy known to patient  No issues with past sedation with any surgeries or procedures No intubation problems in the past  No FH of Malignant Hyperthermia No diet pills per patient No home 02 use per patient  No blood thinners per patient  Pt denies issues with constipation  No A fib or A flutter  EMMI video to pt or via MyChart  COVID 19 guidelines implemented in PV today with Pt and RN   Pt is fully vaccinated  for Covid - she was diagnosed with covid 12-29- she currently has no s/s of covid  03-19-20  Due to the COVID-19 pandemic we are asking patients to follow certain guidelines.  Pt aware of COVID protocols and [facility] guidelines   Pt verified name, DOB, address and insurance during PV today. Pt mailed instruction packet to included paper to complete and mail back to [facility] with addressed and stamped envelope, Emmi video, copy of consent form to read and not return, and instructions. PV completed over the phone. Pt encouraged to call with questions or issues

## 2020-03-25 ENCOUNTER — Other Ambulatory Visit: Payer: Self-pay

## 2020-03-25 ENCOUNTER — Other Ambulatory Visit: Payer: Managed Care, Other (non HMO)

## 2020-03-25 DIAGNOSIS — Z20822 Contact with and (suspected) exposure to covid-19: Secondary | ICD-10-CM

## 2020-03-27 LAB — NOVEL CORONAVIRUS, NAA: SARS-CoV-2, NAA: NOT DETECTED

## 2020-03-27 LAB — SARS-COV-2, NAA 2 DAY TAT

## 2020-03-29 ENCOUNTER — Encounter: Payer: Self-pay | Admitting: Gastroenterology

## 2020-04-02 ENCOUNTER — Ambulatory Visit: Payer: Managed Care, Other (non HMO) | Admitting: Family Medicine

## 2020-04-02 ENCOUNTER — Telehealth: Payer: Self-pay | Admitting: Gastroenterology

## 2020-04-02 ENCOUNTER — Encounter: Payer: Managed Care, Other (non HMO) | Admitting: Gastroenterology

## 2020-04-02 NOTE — Telephone Encounter (Signed)
Understand due to weather conditions. I am sorry that she prepped as well if that was the case. Please place a recall in system for 48-months incase she hasn't called back for rescheduling with me. Thank you. GM

## 2020-04-07 ENCOUNTER — Encounter: Payer: Managed Care, Other (non HMO) | Admitting: Podiatry

## 2020-04-26 ENCOUNTER — Encounter: Payer: Self-pay | Admitting: Podiatry

## 2020-04-26 ENCOUNTER — Ambulatory Visit (INDEPENDENT_AMBULATORY_CARE_PROVIDER_SITE_OTHER): Payer: Managed Care, Other (non HMO) | Admitting: Podiatry

## 2020-04-26 ENCOUNTER — Other Ambulatory Visit: Payer: Self-pay

## 2020-04-26 DIAGNOSIS — L603 Nail dystrophy: Secondary | ICD-10-CM | POA: Diagnosis not present

## 2020-04-26 MED ORDER — TERBINAFINE HCL 250 MG PO TABS: 250.0000 mg | ORAL_TABLET | Freq: Every day | ORAL | 0 refills | Status: DC

## 2020-04-26 NOTE — Progress Notes (Signed)
She presents today for follow-up of her nail fungus.  She is completed her second dose of every other day therapy.  States that she is doing really well and she is wants to know if I can trim her nails for her.  Objective: Vital signs are stable she is alert oriented x3 there is no erythema edema cellulitis drainage or odor.  Toenails are looking much better improving thickness and discoloration.  Assessment: Slowly resolving onychomycosis with the use of long-term therapy Lamisil.  Plan: Debridement of toenails for her today.  We will continue Lamisil therapy 1 tablet every other day.  I will follow-up with her in 3 months.

## 2020-07-09 ENCOUNTER — Encounter: Payer: Managed Care, Other (non HMO) | Admitting: Internal Medicine

## 2020-07-15 ENCOUNTER — Telehealth: Payer: Managed Care, Other (non HMO) | Admitting: Adult Health

## 2020-07-16 ENCOUNTER — Other Ambulatory Visit: Payer: Self-pay

## 2020-07-16 DIAGNOSIS — I10 Essential (primary) hypertension: Secondary | ICD-10-CM

## 2020-07-16 MED ORDER — AMLODIPINE BESYLATE 5 MG PO TABS: 5.0000 mg | ORAL_TABLET | Freq: Every day | ORAL | 1 refills | Status: DC

## 2020-07-16 NOTE — Telephone Encounter (Signed)
Pharmacy requests refill on: Amlodipine 5 mg   LAST REFILL: 01/16/2020 (Q-90, R-1) LAST OV: 12/29/2019 NEXT OV: 09/29/2020 with Marvell Fuller, FNP PHARMACY: [facility]

## 2020-07-30 ENCOUNTER — Ambulatory Visit (AMBULATORY_SURGERY_CENTER): Payer: Managed Care, Other (non HMO)

## 2020-07-30 ENCOUNTER — Other Ambulatory Visit: Payer: Self-pay

## 2020-07-30 VITALS — Ht 63.5 in | Wt 128.0 lb

## 2020-07-30 DIAGNOSIS — Z1211 Encounter for screening for malignant neoplasm of colon: Secondary | ICD-10-CM

## 2020-07-30 NOTE — Progress Notes (Signed)
Pt verified name, DOB, address and insurance during PV today.   Pt mailed instruction packet to include copy of consent form to read and not return, and instructions. PV completed over the phone. Pt encouraged to call with questions or issues.   No allergies to soy or egg Pt is not on blood thinners or diet pills Denies issues with sedation/intubation Denies atrial flutter/fib Denies constipation   Emmi instructions given to pt  Pt is aware of Covid safety and care partner requirements.  

## 2020-08-02 ENCOUNTER — Ambulatory Visit: Payer: Managed Care, Other (non HMO) | Admitting: Podiatry

## 2020-08-02 ENCOUNTER — Other Ambulatory Visit: Payer: Self-pay

## 2020-08-02 ENCOUNTER — Encounter: Payer: Self-pay | Admitting: Podiatry

## 2020-08-02 DIAGNOSIS — L603 Nail dystrophy: Secondary | ICD-10-CM

## 2020-08-02 MED ORDER — TERBINAFINE HCL 250 MG PO TABS: 250.0000 mg | ORAL_TABLET | Freq: Every day | ORAL | 0 refills | Status: DC

## 2020-08-02 NOTE — Progress Notes (Signed)
Presents today for follow-up of her third every other day dosing regimen states is looking much better she is very happy with the outcome.  Objective: Vital signs stable alert oriented x3 there is no erythema edema cellulitis drainage or odor seems to be growing out very nicely.  I see no signs of onychomycosis.  Assessment: Onychomycosis.  Plan: We will allow her to take 1 more dose every other day to assure that this is going to completely to inhibit reoccurrence.  Follow-up with her as needed

## 2020-08-06 ENCOUNTER — Encounter: Payer: Self-pay | Admitting: Gastroenterology

## 2020-08-12 ENCOUNTER — Encounter: Payer: Self-pay | Admitting: Certified Registered Nurse Anesthetist

## 2020-08-13 ENCOUNTER — Other Ambulatory Visit: Payer: Self-pay

## 2020-08-13 ENCOUNTER — Encounter: Payer: Self-pay | Admitting: Gastroenterology

## 2020-08-13 ENCOUNTER — Ambulatory Visit (AMBULATORY_SURGERY_CENTER): Payer: Managed Care, Other (non HMO) | Admitting: Gastroenterology

## 2020-08-13 VITALS — BP 111/77 | HR 73 | Temp 97.8°F | Resp 14 | Ht 63.5 in | Wt 128.0 lb

## 2020-08-13 DIAGNOSIS — Z1211 Encounter for screening for malignant neoplasm of colon: Secondary | ICD-10-CM

## 2020-08-13 MED ORDER — SODIUM CHLORIDE 0.9 % IV SOLN: 500.0000 mL | Freq: Once | INTRAVENOUS | Status: DC

## 2020-08-13 NOTE — Patient Instructions (Signed)
Handouts provided on hemorrhoids and high-fiber diet.   Recommend a high-fiber diet (see handout).  Use FiberCon 1-2 tablets by mouth daily.   Repeat colonoscopy in 10 years for screening purposes.   YOU HAD AN ENDOSCOPIC PROCEDURE TODAY AT THE [facility]:   Refer to the procedure report that was given to you for any specific questions about what was found during the examination.  If the procedure report does not answer your questions, please call your gastroenterologist to clarify.  If you requested that your care partner not be given the details of your procedure findings, then the procedure report has been included in a sealed envelope for you to review at your convenience later.  YOU SHOULD EXPECT: Some feelings of bloating in the abdomen. Passage of more gas than usual.  Walking can help get rid of the air that was put into your GI tract during the procedure and reduce the bloating. If you had a lower endoscopy (such as a colonoscopy or flexible sigmoidoscopy) you may notice spotting of blood in your stool or on the toilet paper. If you underwent a bowel prep for your procedure, you may not have a normal bowel movement for a few days.  Please Note:  You might notice some irritation and congestion in your nose or some drainage.  This is from the oxygen used during your procedure.  There is no need for concern and it should clear up in a day or so.  SYMPTOMS TO REPORT IMMEDIATELY:   Following lower endoscopy (colonoscopy or flexible sigmoidoscopy):  Excessive amounts of blood in the stool  Significant tenderness or worsening of abdominal pains  Swelling of the abdomen that is new, acute  Fever of 100F or higher  For urgent or emergent issues, a gastroenterologist can be reached at any hour by calling [phone]. Do not use MyChart messaging for urgent concerns.    DIET:  We do recommend a small meal at first, but then you may proceed to your regular diet.  Drink plenty  of fluids but you should avoid alcoholic beverages for 24 hours.  ACTIVITY:  You should plan to take it easy for the rest of today and you should NOT DRIVE or use heavy machinery until tomorrow (because of the sedation medicines used during the test).    FOLLOW UP: Our staff will call the number listed on your records 48-72 hours following your procedure to check on you and address any questions or concerns that you may have regarding the information given to you following your procedure. If we do not reach you, we will leave a message.  We will attempt to reach you two times.  During this call, we will ask if you have developed any symptoms of COVID 19. If you develop any symptoms (ie: fever, flu-like symptoms, shortness of breath, cough etc.) before then, please call [phone].  If you test positive for Covid 19 in the 2 weeks post procedure, please call and report this information to [place].    If any biopsies were taken you will be contacted by phone or by letter within the next 1-3 weeks.  Please call us at [phone] if you have not heard about the biopsies in 3 weeks.    SIGNATURES/CONFIDENTIALITY: You and/or your care partner have signed paperwork which will be entered into your electronic medical record.  These signatures attest to the fact that that the information above on your After Visit Summary has been reviewed and is understood.  Full responsibility of the confidentiality of this discharge information lies with you and/or your care-partner.

## 2020-08-13 NOTE — Progress Notes (Signed)
Report given to PACU, vss 

## 2020-08-13 NOTE — Op Note (Addendum)
[facility] Patient Name: Lori Beasley Procedure Date: 08/13/2020 9:32 AM MRN: [id number] Endoscopist: Justice Britain , MD Age: 50 Referring MD:  Date of Birth: 1971-02-28 Gender: Female Account #: [id number] Procedure:                Colonoscopy Indications:              Screening for malignant neoplasm in the colon, This                            is the patient's first colonoscopy Medicines:                Monitored Anesthesia Care Procedure:                Pre-Anesthesia Assessment:                           - Prior to the procedure, a History and Physical                            was performed, and patient medications and                            allergies were reviewed. The patient's tolerance of                            previous anesthesia was also reviewed. The risks                            and benefits of the procedure and the sedation                            options and risks were discussed with the patient.                            All questions were answered, and informed consent                            was obtained. Prior Anticoagulants: The patient has                            taken no previous anticoagulant or antiplatelet                            agents. ASA Grade Assessment: II - A patient with                            mild systemic disease. After reviewing the risks                            and benefits, the patient was deemed in                            satisfactory condition to undergo the procedure.  After obtaining informed consent, the colonoscope                            was passed under direct vision. Throughout the                            procedure, the patient's blood pressure, pulse, and                            oxygen saturations were monitored continuously. The                            Olympus PCF-H190DL ([id number]) Colonoscope was                            introduced through  the anus and advanced to the 5                            cm into the ileum. The colonoscopy was performed                            without difficulty. The patient tolerated the                            procedure. The quality of the bowel preparation was                            adequate. The terminal ileum, ileocecal valve,                            appendiceal orifice, and rectum were photographed. Scope In: 9:41:00 AM Scope Out: 9:58:13 AM Scope Withdrawal Time: 0 hours 13 minutes 36 seconds  Total Procedure Duration: 0 hours 17 minutes 13 seconds  Findings:                 The digital rectal exam was normal. Pertinent                            negatives include no palpable rectal lesions.                           The terminal ileum and ileocecal valve appeared                            normal.                           Normal mucosa was found in the entire colon.                           A few small-mouthed diverticula were found in the                            sigmoid colon and transverse colon.  Non-bleeding non-thrombosed internal hemorrhoids                            were found during retroflexion. The hemorrhoids                            were Grade I (internal hemorrhoids that do not                            prolapse). Complications:            No immediate complications. Estimated Blood Loss:     Estimated blood loss: none. Impression:               - The examined portion of the ileum was normal.                           - A few small-mouthed diverticula were found in the                            sigmoid colon and transverse colon.                           - Normal mucosa in the entire examined colon.                           - Non-bleeding non-thrombosed internal hemorrhoids. Recommendation:           - The patient will be observed post-procedure,                            until all discharge criteria are met.                            - Discharge patient to home.                           - Patient has a contact number available for                            emergencies. The signs and symptoms of potential                            delayed complications were discussed with the                            patient. Return to normal activities tomorrow.                            Written discharge instructions were provided to the                            patient.                           - High fiber diet.                           -  Use FiberCon 1-2 tablets PO daily.                           - Continue present medications.                           - Repeat colonoscopy in 10 years for screening                            purposes.                           - The findings and recommendations were discussed                            with the patient. Justice Britain, MD 08/13/2020 10:02:44 AM

## 2020-08-13 NOTE — Progress Notes (Signed)
Medical history reviewed with no changes since PV. VS assessed by C.W 

## 2020-08-17 ENCOUNTER — Telehealth: Payer: Self-pay | Admitting: *Deleted

## 2020-08-17 NOTE — Telephone Encounter (Signed)
  Follow up Call-  Call back number 08/13/2020  Post procedure Call Back phone  # [phone]  Permission to leave phone message Yes  Some recent data might be hidden     Patient questions:  Do you have a fever, pain , or abdominal swelling? No. Pain Score  0 *  Have you tolerated food without any problems? Yes.    Have you been able to return to your normal activities? Yes.    Do you have any questions about your discharge instructions: Diet   No. Medications  No. Follow up visit  No.  Do you have questions or concerns about your Care? No.  Actions: * If pain score is 4 or above: No action needed, pain <4.  1. Have you developed a fever since your procedure? no  2.   Have you had an respiratory symptoms (SOB or cough) since your procedure? no  3.   Have you tested positive for COVID 19 since your procedure no  4.   Have you had any family members/close contacts diagnosed with the COVID 19 since your procedure?  no   If yes to any of these questions please route to Laverna Peace, RN and Karlton Lemon, RN

## 2020-09-16 ENCOUNTER — Telehealth: Payer: Managed Care, Other (non HMO) | Admitting: Adult Health

## 2020-09-29 ENCOUNTER — Ambulatory Visit: Payer: Managed Care, Other (non HMO) | Admitting: Adult Health

## 2020-09-30 ENCOUNTER — Other Ambulatory Visit: Payer: Self-pay | Admitting: Adult Health

## 2020-10-13 ENCOUNTER — Telehealth: Payer: Self-pay | Admitting: Family Medicine

## 2020-10-13 ENCOUNTER — Other Ambulatory Visit: Payer: Self-pay | Admitting: Family Medicine

## 2020-10-13 DIAGNOSIS — B009 Herpesviral infection, unspecified: Secondary | ICD-10-CM

## 2020-10-13 DIAGNOSIS — E119 Type 2 diabetes mellitus without complications: Secondary | ICD-10-CM

## 2020-10-13 DIAGNOSIS — E782 Mixed hyperlipidemia: Secondary | ICD-10-CM

## 2020-10-13 NOTE — Telephone Encounter (Signed)
Last office visit 12/19/19 No upcoming appointment scheduled Last refill Glipizide 02/09/20 #60/5 Valtrex Last refill 09/24/19 #30/5 Crestor LR 10/29/19 #90/3

## 2020-10-13 NOTE — Telephone Encounter (Signed)
  Encourage patient to contact the pharmacy for refills or they can request refills through [facility]  LAST APPOINTMENT DATE:  Please schedule appointment if longer than 1 year  NEXT APPOINTMENT DATE:  MEDICATION:glipiZIDE (GLUCOTROL) 10 MG table rosuvastatin (CRESTOR) 10 MG table Is the patient out of medication?  valACYclovir (VALTREX) 500 MG table PHARMACY:[id number] - [place]  Let patient know to contact pharmacy at the end of the day to make sure medication is ready.  Please notify patient to allow 48-72 hours to process  CLINICAL FILLS OUT ALL BELOW:   LAST REFILL:  QTY:  REFILL DATE:    OTHER COMMENTS:    Okay for refill?  Please advise

## 2020-10-13 NOTE — Telephone Encounter (Signed)
  Encourage patient to contact the pharmacy for refills or they can request refills through [facility]  LAST APPOINTMENT DATE:  Please schedule appointment if longer than 1 year  NEXT APPOINTMENT DATE:  MEDICATION:  Is the patient ouglipiZIDE (GLUCOTROL) 10 MG tabletke sure medication is ready.  Please notify patient to allow 48-72 hours to process  CLINICAL FILLS OUT ALL BELOW:   LAST REFILL:  QTY:  REFILL DATE:    OTHER COMMENTS:    Okay for refill?  Please advise

## 2020-10-14 MED ORDER — GLIPIZIDE 10 MG PO TABS: 10.0000 mg | ORAL_TABLET | Freq: Two times a day (BID) | ORAL | 0 refills | Status: DC

## 2020-10-14 MED ORDER — ROSUVASTATIN CALCIUM 10 MG PO TABS: 10.0000 mg | ORAL_TABLET | Freq: Every day | ORAL | 0 refills | Status: DC

## 2020-10-14 MED ORDER — VALACYCLOVIR HCL 500 MG PO TABS: 500.0000 mg | ORAL_TABLET | Freq: Every day | ORAL | 0 refills | Status: DC

## 2020-10-28 ENCOUNTER — Encounter: Payer: Self-pay | Admitting: Adult Health

## 2020-11-03 ENCOUNTER — Encounter: Payer: Managed Care, Other (non HMO) | Admitting: Podiatry

## 2020-11-08 ENCOUNTER — Ambulatory Visit (INDEPENDENT_AMBULATORY_CARE_PROVIDER_SITE_OTHER): Payer: Managed Care, Other (non HMO) | Admitting: Podiatry

## 2020-11-08 ENCOUNTER — Other Ambulatory Visit: Payer: Self-pay

## 2020-11-08 ENCOUNTER — Encounter: Payer: Self-pay | Admitting: Podiatry

## 2020-11-08 DIAGNOSIS — L603 Nail dystrophy: Secondary | ICD-10-CM

## 2020-11-08 NOTE — Progress Notes (Signed)
She presents today for follow-up of her onychomycosis she has completed 3 every other day doses of Lamisil 30 tablets and she is also completed 120 days of tablets.  She states that she feels that they are doing perfectly well.  Objective: Vital signs are stable she alert oriented x3 there is no erythema edema/drainage or odor nail plates appear to be healing very nicely no discoloration.  Assessment: Well-healing onychomycosis long-term therapy of Lamisil.  Plan: Follow-up with me on an as-needed basis discontinue the use of her Lamisil.

## 2020-11-23 ENCOUNTER — Ambulatory Visit: Payer: Managed Care, Other (non HMO) | Admitting: Adult Health

## 2020-11-25 ENCOUNTER — Other Ambulatory Visit: Payer: Self-pay | Admitting: Family Medicine

## 2020-11-25 ENCOUNTER — Other Ambulatory Visit: Payer: Self-pay | Admitting: Family

## 2020-11-25 DIAGNOSIS — B009 Herpesviral infection, unspecified: Secondary | ICD-10-CM

## 2020-11-25 DIAGNOSIS — E119 Type 2 diabetes mellitus without complications: Secondary | ICD-10-CM

## 2020-11-25 MED ORDER — VALACYCLOVIR HCL 500 MG PO TABS: 500.0000 mg | ORAL_TABLET | Freq: Every day | ORAL | 0 refills | Status: DC

## 2020-11-25 NOTE — Telephone Encounter (Signed)
Pt needs refills on valACYclovir (VALTREX) 500 MG tablet and valACYclovir (VALTREX) 500 MG tablet sent to [facility]  Pt has a TOC scheduled with [facility] in Nov

## 2020-11-25 NOTE — Telephone Encounter (Signed)
Name of Medication: Valacyclovir 500 mg Name of Pharmacy: [facility] [address] Last Fill or Written Date and Quantity: # 30 on 10/14/20 by Ventura Bruns FNP Last Office Visit and Type: 12/29/2019 Harlin Heys FNP for FU Next Office Visit and Type: 01/11/21 with Marvell Fuller at [facility] as new pt.  Sending note to [facility]

## 2020-11-25 NOTE — Telephone Encounter (Signed)
Pt notified that the valacyclovir was sent to [facility] on [address] at [place]; pt voiced understanding and pt asked about other med. I advised pt that was only request for refill in this note; if pt would ck with pharmacy to be sure no refills available and if not send refill request to [facility]. I told pt I would be glad since on phone to see if could get refill request put in but pt said she would ck with [facility]. Nothing further needed at this time.

## 2020-11-30 ENCOUNTER — Other Ambulatory Visit: Payer: Self-pay | Admitting: Family

## 2020-11-30 DIAGNOSIS — I10 Essential (primary) hypertension: Secondary | ICD-10-CM

## 2020-12-02 NOTE — Telephone Encounter (Signed)
Pt called in stated she is out of her medication need's refills   Encourage patient to contact the pharmacy for refills or they can request refills through [facility]  LAST APPOINTMENT DATE:  Please schedule appointment if longer than 1 year  NEXT APPOINTMENT DATE:  MEDICATION:glipiZIDE (GLUCOTROL) 10 MG tablet rosuvastatin (CRESTOR) 10 MG tablet  Is the patient out of medication?   PHARMACY:[id number] [address], [address] [place]  Let patient know to contact pharmacy at the end of the day to make sure medication is ready.  Please notify patient to allow 48-72 hours to process  CLINICAL FILLS OUT ALL BELOW:   LAST REFILL:  QTY:  REFILL DATE:    OTHER COMMENTS:    Okay for refill?  Please advise

## 2020-12-02 NOTE — Telephone Encounter (Signed)
Last OV- 12/29/2019  Next OV- N/A  Last Filled -07/16/2020

## 2020-12-09 NOTE — Telephone Encounter (Signed)
Pt called to check status on refill. It is showing pending still. Please advise.

## 2020-12-17 NOTE — Telephone Encounter (Signed)
Pt wants to wait for female provider to do TOC   ... Encourage patient to contact the pharmacy for refills or they can request refills through [facility]  LAST APPOINTMENT DATE:  Please schedule appointment if longer than 1 year  NEXT APPOINTMENT DATE:  MEDICATION:rosuvastatin (CRESTOR) 10 MG tablet glipiZIDE (GLUCOTROL) 10 MG tablet  Is the patient out of medication?   PHARMACY:[facility] -    Let patient know to contact pharmacy at the end of the day to make sure medication is ready.  Please notify patient to allow 48-72 hours to process  CLINICAL FILLS OUT ALL BELOW:   LAST REFILL:  QTY:  REFILL DATE:    OTHER COMMENTS:    Okay for refill?  Please advise

## 2021-01-11 ENCOUNTER — Ambulatory Visit: Payer: Managed Care, Other (non HMO) | Admitting: Adult Health

## 2021-01-25 ENCOUNTER — Ambulatory Visit: Payer: Managed Care, Other (non HMO) | Admitting: Adult Health

## 2021-02-07 ENCOUNTER — Telehealth: Payer: Self-pay | Admitting: Family Medicine

## 2021-02-07 DIAGNOSIS — E782 Mixed hyperlipidemia: Secondary | ICD-10-CM

## 2021-02-07 DIAGNOSIS — E119 Type 2 diabetes mellitus without complications: Secondary | ICD-10-CM

## 2021-02-07 MED ORDER — ROSUVASTATIN CALCIUM 10 MG PO TABS: 10.0000 mg | ORAL_TABLET | Freq: Every day | ORAL | 0 refills | Status: DC

## 2021-02-07 MED ORDER — GLIPIZIDE 10 MG PO TABS: ORAL_TABLET | ORAL | 0 refills | Status: DC

## 2021-02-07 NOTE — Telephone Encounter (Signed)
Pt called In requesting a refill on the medication-rosuvastatin a month ago and she did not receive it. Pt informed me that she switched from [name] to [name] as a new pt and the appt kept getting rescheduled because the provider was out the office. Now the pt is with [name] as a new pt but she needs her medication refilled. Rosuvastatin and glipizide is the medication that needs to be refilled. Pt normally gets RX delivery but pt will be out town beginning next week so she wants to get the medication through [name] on [address] [employer] [place]

## 2021-02-07 NOTE — Telephone Encounter (Addendum)
Last office visit 12/29/19 Upcoming appointment Dr. Roma Schanz Vigg 03/03/21 Last refill Glipizide 12/13/20 #60 Last refill Crestor 10/14/20 #90

## 2021-02-11 ENCOUNTER — Other Ambulatory Visit: Payer: Self-pay | Admitting: Family

## 2021-02-11 DIAGNOSIS — E119 Type 2 diabetes mellitus without complications: Secondary | ICD-10-CM

## 2021-02-14 ENCOUNTER — Other Ambulatory Visit: Payer: Self-pay | Admitting: Family

## 2021-02-14 DIAGNOSIS — E119 Type 2 diabetes mellitus without complications: Secondary | ICD-10-CM

## 2021-02-14 DIAGNOSIS — E782 Mixed hyperlipidemia: Secondary | ICD-10-CM

## 2021-02-14 MED ORDER — ROSUVASTATIN CALCIUM 10 MG PO TABS: 10.0000 mg | ORAL_TABLET | Freq: Every day | ORAL | 0 refills | Status: DC

## 2021-02-14 MED ORDER — GLIPIZIDE 10 MG PO TABS: ORAL_TABLET | ORAL | 0 refills | Status: DC

## 2021-02-14 NOTE — Telephone Encounter (Signed)
Lori Beasley called in and stated that she needs a 90 day supply and she needs them and she is completely out and she needs them due to she will be out of town for 2 weeks.   Glipizide, and crestor both are needing 90 day supply

## 2021-03-03 ENCOUNTER — Ambulatory Visit: Payer: Managed Care, Other (non HMO) | Admitting: Internal Medicine

## 2021-04-15 ENCOUNTER — Other Ambulatory Visit: Payer: Self-pay | Admitting: Family Medicine

## 2021-04-15 DIAGNOSIS — E119 Type 2 diabetes mellitus without complications: Secondary | ICD-10-CM

## 2021-04-15 NOTE — Addendum Note (Signed)
Addended by: Patience Musca on: 04/15/2021 10:00 AM   Modules accepted: Orders

## 2021-04-15 NOTE — Telephone Encounter (Signed)
°  Encourage patient to contact the pharmacy for refills or they can request refills through [facility]  LAST APPOINTMENT DATE:  Please schedule appointment if longer than 1 year  NEXT APPOINTMENT DATE:  MEDICATION:glipiZIDE (GLUCOTROL) 10 MG tablet  Is the patient out of medication?   PHARMACY:[id number] - [place], [place] - [address]  Let patient know to contact pharmacy at the end of the day to make sure medication is ready.  Please notify patient to allow 48-72 hours to process  CLINICAL FILLS OUT ALL BELOW:   LAST REFILL:  QTY:  REFILL DATE:    OTHER COMMENTS:    Okay for refill?  Please advise

## 2021-04-15 NOTE — Telephone Encounter (Signed)
Name of Medication: glipizide 10 mg Name of Pharmacy: [facility] [address] Last Fill or Written Date and Quantity: # 60 on 02/14/2021 Last Office Visit and Type: 12/29/2019 Next Office Visit and Type:scheduled NP appt at [facility] 05/12/2021   Sending refill request to [facility] Pool

## 2021-04-16 ENCOUNTER — Other Ambulatory Visit: Payer: Self-pay | Admitting: Family

## 2021-04-16 DIAGNOSIS — E119 Type 2 diabetes mellitus without complications: Secondary | ICD-10-CM

## 2021-04-18 NOTE — Telephone Encounter (Signed)
°  Encourage patient to contact the pharmacy for refills or they can request refills through [facility]  LAST APPOINTMENT DATE:  Please schedule appointment if longer than 1 year  NEXT APPOINTMENT DATE: 05/12/21 at [facility]  MEDICATION: glipizide, lisinopril (90 day supply)   Is the patient out of medication? yes  [facility] - [address]   Let patient know to contact pharmacy at the end of the day to make sure medication is ready.  Please notify patient to allow 48-72 hours to process  CLINICAL FILLS OUT ALL BELOW:   LAST REFILL:  QTY:  REFILL DATE:    OTHER COMMENTS:    Okay for refill?  Please advise    She has an apt 3/2

## 2021-04-19 ENCOUNTER — Other Ambulatory Visit: Payer: Self-pay | Admitting: Family Medicine

## 2021-04-19 DIAGNOSIS — I10 Essential (primary) hypertension: Secondary | ICD-10-CM

## 2021-04-19 MED ORDER — LISINOPRIL 40 MG PO TABS: 40.0000 mg | ORAL_TABLET | Freq: Every day | ORAL | 0 refills | Status: DC

## 2021-04-19 NOTE — Telephone Encounter (Signed)
Name of Medication: lisinopril 40 mg Name of Pharmacy: [facility] [address] Last Fill or Written Date and Quantity: # 90 x 3 on 01/15/2020 Last Office Visit and Type: 12/29/2019 annual with Harlin Heys FNP Next Office Visit and Type: 05/12/21 NP at [facility]  Sending note to [facility] Pool

## 2021-04-19 NOTE — Telephone Encounter (Signed)
°  Encourage patient to contact the pharmacy for refills or they can request refills through [facility]  LAST APPOINTMENT DATE:  Please schedule appointment if longer than 1 year  NEXT APPOINTMENT DATE:  MEDICATION:lisinopril (ZESTRIL) 40 MG tablet  Is the patient out of medication?   PHARMACY:[id number] - [place], [place] - [address]  Let patient know to contact pharmacy at the end of the day to make sure medication is ready.  Please notify patient to allow 48-72 hours to process  CLINICAL FILLS OUT ALL BELOW:   LAST REFILL:  QTY:  REFILL DATE:    OTHER COMMENTS:    Okay for refill?  Please advise

## 2021-04-19 NOTE — Addendum Note (Signed)
Addended by: Patience Musca on: 04/19/2021 09:18 AM   Modules accepted: Orders

## 2021-05-12 ENCOUNTER — Ambulatory Visit: Payer: Managed Care, Other (non HMO) | Admitting: Internal Medicine

## 2021-05-12 ENCOUNTER — Other Ambulatory Visit: Payer: Self-pay

## 2021-05-12 ENCOUNTER — Encounter: Payer: Self-pay | Admitting: Internal Medicine

## 2021-05-12 VITALS — BP 152/80 | HR 94 | Temp 97.7°F | Ht 63.19 in | Wt 136.0 lb

## 2021-05-12 DIAGNOSIS — I10 Essential (primary) hypertension: Secondary | ICD-10-CM

## 2021-05-12 DIAGNOSIS — E782 Mixed hyperlipidemia: Secondary | ICD-10-CM | POA: Diagnosis not present

## 2021-05-12 DIAGNOSIS — E119 Type 2 diabetes mellitus without complications: Secondary | ICD-10-CM

## 2021-05-12 LAB — MICROALBUMIN, URINE WAIVED
Creatinine, Urine Waived: 50 mg/dL (ref 10–300)
Microalb, Ur Waived: 10 mg/L (ref 0–19)
Microalb/Creat Ratio: <30 mg/g (ref <30)

## 2021-05-12 LAB — BAYER DCA HB A1C WAIVED: HB A1C (BAYER DCA - WAIVED): 7.8 % — ABNORMAL HIGH (ref 4.8–5.6)

## 2021-05-12 NOTE — Progress Notes (Signed)
BP (!) 152/80    Pulse 94    Temp 97.7 F (36.5 C) (Oral)    Ht 5' 3.19" (1.605 m)    Wt 136 lb (61.7 kg)    SpO2 98%    BMI 23.95 kg/m    Subjective:    Patient ID: Lori Beasley, female    DOB: May 21, 1970, 51 y.o.   MRN: [id number]  Chief Complaint  Patient presents with   New Patient (Initial Visit)    HPI: Lori Beasley is a 51 y.o. female  Pt moved from [place], her last pcp reitred moved and pt needed a new pcp. Pt has a ho DM- is on metfromin and glipizide for such - 09/11/19 09:11 Hemoglobin A1C: 10.4 (H)  12/29/19 14:50 Hemoglobin A1C: 7.9 !   Diabetes She presents for her follow-up (has had diabetes x 15 yrs last a1c 7.9) diabetic visit. She has type 2 diabetes mellitus. Pertinent negatives for hypoglycemia include no confusion, dizziness, headaches, nervousness/anxiousness or speech difficulty. Pertinent negatives for diabetes include no chest pain, no fatigue, no polydipsia, no polyphagia, no polyuria and no weakness.  Hypertension This is a chronic (is on norvasc) problem. The current episode started more than 1 year ago. Pertinent negatives include no chest pain, headaches, palpitations or shortness of breath.   Chief Complaint  Patient presents with   New Patient (Initial Visit)    Relevant past medical, surgical, family and social history reviewed and updated as indicated. Interim medical history since our last visit reviewed. Allergies and medications reviewed and updated.  Review of Systems  Constitutional:  Negative for activity change, appetite change, chills, fatigue and fever.  HENT:  Negative for congestion.   Eyes:  Negative for visual disturbance.  Respiratory:  Negative for apnea, cough, chest tightness, shortness of breath and wheezing.   Cardiovascular:  Negative for chest pain, palpitations and leg swelling.  Gastrointestinal:  Negative for abdominal distention, abdominal pain, diarrhea and nausea.  Endocrine: Negative  for cold intolerance, heat intolerance, polydipsia, polyphagia and polyuria.  Genitourinary:  Negative for difficulty urinating, frequency, hematuria and urgency.  Skin:  Negative for color change and rash.  Neurological:  Negative for dizziness, speech difficulty, weakness, light-headedness, numbness and headaches.  Psychiatric/Behavioral:  Negative for behavioral problems and confusion. The patient is not nervous/anxious.    Per HPI unless specifically indicated above     Objective:    BP (!) 152/80    Pulse 94    Temp 97.7 F (36.5 C) (Oral)    Ht 5' 3.19" (1.605 m)    Wt 136 lb (61.7 kg)    SpO2 98%    BMI 23.95 kg/m   Wt Readings from Last 3 Encounters:  05/12/21 136 lb (61.7 kg)  08/13/20 128 lb (58.1 kg)  07/30/20 128 lb (58.1 kg)    Physical Exam Vitals and nursing note reviewed.  Constitutional:      General: She is not in acute distress.    Appearance: Normal appearance. She is not ill-appearing or diaphoretic.  HENT:     Head: Normocephalic and atraumatic.     Right Ear: Tympanic membrane and external ear normal. There is no impacted cerumen.     Left Ear: External ear normal.     Nose: No congestion or rhinorrhea.     Mouth/Throat:     Pharynx: No oropharyngeal exudate or posterior oropharyngeal erythema.  Eyes:     Conjunctiva/sclera: Conjunctivae normal.     Pupils: Pupils  are equal, round, and reactive to light.  Cardiovascular:     Rate and Rhythm: Normal rate and regular rhythm.     Heart sounds: No murmur heard.   No friction rub. No gallop.  Pulmonary:     Effort: No respiratory distress.     Breath sounds: No stridor. No wheezing or rhonchi.  Chest:     Chest wall: No tenderness.  Abdominal:     General: Abdomen is flat. Bowel sounds are normal. There is no distension.     Palpations: Abdomen is soft. There is no mass.     Tenderness: There is no abdominal tenderness. There is no guarding.  Musculoskeletal:        General: No swelling or deformity.      Cervical back: Normal range of motion and neck supple. No rigidity or tenderness.     Right lower leg: No edema.     Left lower leg: No edema.  Skin:    General: Skin is warm and dry.     Coloration: Skin is not jaundiced.     Findings: No erythema.  Neurological:     Mental Status: She is alert and oriented to person, place, and time. Mental status is at baseline.  Psychiatric:        Mood and Affect: Mood normal.        Behavior: Behavior normal.        Thought Content: Thought content normal.        Judgment: Judgment normal.    Results for orders placed or performed in visit on 05/12/21  Bayer DCA Hb A1c Waived (STAT)  Result Value Ref Range   HB A1C (BAYER DCA - WAIVED) 7.8 (H) 4.8 - 5.6 %  Microalbumin, Urine Waived  Result Value Ref Range   Microalb, Ur Waived 10 0 - 19 mg/L   Creatinine, Urine Waived 50 10 - 300 mg/dL   Microalb/Creat Ratio <30 <30 mg/g        Current Outpatient Medications:    amLODipine (NORVASC) 5 MG tablet, TAKE 1 TABLET BY MOUTH  DAILY, Disp: 90 tablet, Rfl: 3   Blood Glucose Monitoring Suppl (FIFTY50 GLUCOSE METER 2.0) w/Device KIT, , Disp: , Rfl:    empagliflozin (JARDIANCE) 10 MG TABS tablet, Take 1 tablet (10 mg total) by mouth daily., Disp: 30 tablet, Rfl: 1   glucose blood test strip, , Disp: , Rfl:    Lancets MISC, , Disp: , Rfl:    lisinopril (ZESTRIL) 40 MG tablet, Take 1 tablet (40 mg total) by mouth daily., Disp: 90 tablet, Rfl: 0   valACYclovir (VALTREX) 500 MG tablet, Take 1 tablet (500 mg total) by mouth daily., Disp: 30 tablet, Rfl: 0   metFORMIN (GLUCOPHAGE) 500 MG tablet, Take 1 tablet (500 mg total) by mouth 2 (two) times daily with a meal., Disp: 60 tablet, Rfl: 3  Current Facility-Administered Medications:    0.9 %  sodium chloride infusion, 500 mL, Intravenous, Once, Mansouraty, Telford Nab., MD    Assessment & Plan:  DM; needs labs  check HbA1c,  urine  microalbumin  diabetic diet plan given to pt  adviced regarding  hypoglycemia and instructions given to pt today on how to prevent and treat the same if it were to occur. pt acknowledges the plan and voices understanding of the same.  exercise plan given and encouraged.   advice diabetic yearly podiatry, ophthalmology , nutritionist , dental check q 6 months,   2. HLD  On crestor  recheck FLP, check LFT's work on diet, SE of meds explained to pt. low fat and high fiber diet explained to pt.   3. HTN is on lisinopril and Norvasc for such   Continue current meds.  Medication compliance emphasised. pt advised to keep Bp logs. Pt verbalised understanding of the same. Pt to have a low salt diet . Exercise to reach a goal of at least 150 mins a week.  lifestyle modifications explained and pt understands importance of the above. Under good control on current regimen. Continue current regimen. Continue to monitor. Call with any concerns. Refills given. Labs drawn today.  Problem List Items Addressed This Visit       Cardiovascular and Mediastinum   Essential hypertension   Relevant Orders   CBC with Differential/Platelet   Comprehensive metabolic panel   Lipid panel   Urinalysis, Routine w reflex microscopic   TSH     Endocrine   Diabetes mellitus without complication (HCC) - Primary   Relevant Medications   empagliflozin (JARDIANCE) 10 MG TABS tablet   metFORMIN (GLUCOPHAGE) 500 MG tablet   Other Relevant Orders   Bayer DCA Hb A1c Waived (STAT) (Completed)   Microalbumin, Urine Waived (Completed)   CBC with Differential/Platelet   Comprehensive metabolic panel   Lipid panel   Urinalysis, Routine w reflex microscopic   TSH     Other   Hyperlipidemia   Relevant Orders   CBC with Differential/Platelet   Comprehensive metabolic panel   Lipid panel   Urinalysis, Routine w reflex microscopic   TSH   Other Visit Diagnoses     Type 2 diabetes mellitus without complication, without long-term current use of insulin (HCC)       Relevant  Medications   empagliflozin (JARDIANCE) 10 MG TABS tablet   metFORMIN (GLUCOPHAGE) 500 MG tablet        Orders Placed This Encounter  Procedures   Bayer DCA Hb A1c Waived (STAT)   Microalbumin, Urine Waived   CBC with Differential/Platelet   Comprehensive metabolic panel   Lipid panel   Urinalysis, Routine w reflex microscopic   TSH     Meds ordered this encounter  Medications   empagliflozin (JARDIANCE) 10 MG TABS tablet    Sig: Take 1 tablet (10 mg total) by mouth daily.    Dispense:  30 tablet    Refill:  1   metFORMIN (GLUCOPHAGE) 500 MG tablet    Sig: Take 1 tablet (500 mg total) by mouth 2 (two) times daily with a meal.    Dispense:  60 tablet    Refill:  3     Follow up plan: Return in about 3 weeks (around 06/02/2021).

## 2021-05-13 MED ORDER — METFORMIN HCL 500 MG PO TABS: 500.0000 mg | ORAL_TABLET | Freq: Two times a day (BID) | ORAL | 3 refills | Status: DC

## 2021-05-13 MED ORDER — EMPAGLIFLOZIN 10 MG PO TABS: 10.0000 mg | ORAL_TABLET | Freq: Every day | ORAL | 1 refills | Status: DC

## 2021-05-13 NOTE — Progress Notes (Signed)
Pl let her know im adding jardiance for pt stop glipizide as dw pt yesterday pl let  rknow her a1c his high at 7.8 needs to increase metformin to bid as rx by last pcp

## 2021-05-26 ENCOUNTER — Telehealth: Payer: Self-pay | Admitting: Internal Medicine

## 2021-05-26 ENCOUNTER — Other Ambulatory Visit: Payer: Managed Care, Other (non HMO)

## 2021-05-26 NOTE — Telephone Encounter (Signed)
Copied from CRM [id number]. 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

## 2021-05-26 NOTE — Telephone Encounter (Signed)
Lab orders have been faxed to Lab copr at [phone] ?

## 2021-06-02 ENCOUNTER — Ambulatory Visit: Payer: Managed Care, Other (non HMO) | Admitting: Internal Medicine

## 2021-06-02 ENCOUNTER — Other Ambulatory Visit: Payer: Self-pay

## 2021-06-02 ENCOUNTER — Encounter: Payer: Self-pay | Admitting: Internal Medicine

## 2021-06-02 VITALS — BP 158/73 | HR 100 | Temp 98.4°F | Ht 63.19 in | Wt 138.0 lb

## 2021-06-02 DIAGNOSIS — B009 Herpesviral infection, unspecified: Secondary | ICD-10-CM

## 2021-06-02 DIAGNOSIS — E1169 Type 2 diabetes mellitus with other specified complication: Secondary | ICD-10-CM | POA: Diagnosis not present

## 2021-06-02 MED ORDER — VALACYCLOVIR HCL 500 MG PO TABS: 500.0000 mg | ORAL_TABLET | Freq: Every day | ORAL | 0 refills | Status: DC

## 2021-06-02 MED ORDER — EMPAGLIFLOZIN 10 MG PO TABS: 25.0000 mg | ORAL_TABLET | Freq: Every day | ORAL | 1 refills | Status: DC

## 2021-06-02 NOTE — Progress Notes (Signed)
? ?BP (!) 158/73   Pulse 100   Temp 98.4 ?F (36.9 ?C) (Oral)   Ht 5' 3.19" (1.605 m)   Wt 138 lb (62.6 kg)   SpO2 99%   BMI 24.30 kg/m?   ? ?Subjective:  ? ? Patient ID: Lori Beasley, female    DOB: [birth year], 51 y.o.   MRN: [id number] ? ?Chief Complaint  ?Patient presents with  ? Diabetes  ? ? ?HPI: ?Lori Beasley is a 51 y.o. female ? ?Patient presents with: ?Diabetes ? ? ? ?Diabetes ?She presents for her follow-up diabetic visit. She has type 2 diabetes mellitus.  ? ?Chief Complaint  ?Patient presents with  ? Diabetes  ? ? ?Relevant past medical, surgical, family and social history reviewed and updated as indicated. Interim medical history since our last visit reviewed. ?Allergies and medications reviewed and updated. ? ?Review of Systems ? ?Per HPI unless specifically indicated above ? ?   ?Objective:  ?  ?BP (!) 158/73   Pulse 100   Temp 98.4 ?F (36.9 ?C) (Oral)   Ht 5' 3.19" (1.605 m)   Wt 138 lb (62.6 kg)   SpO2 99%   BMI 24.30 kg/m?   ?Wt Readings from Last 3 Encounters:  ?06/02/21 138 lb (62.6 kg)  ?05/12/21 136 lb (61.7 kg)  ?08/13/20 128 lb (58.1 kg)  ?  ?Physical Exam ?Vitals and nursing note reviewed.  ?Constitutional:   ?   General: She is not in acute distress. ?   Appearance: Normal appearance. She is not ill-appearing or diaphoretic.  ?Eyes:  ?   Conjunctiva/sclera: Conjunctivae normal.  ?Pulmonary:  ?   Breath sounds: No rhonchi.  ?Abdominal:  ?   General: Abdomen is flat. Bowel sounds are normal. There is no distension.  ?   Palpations: Abdomen is soft. There is no mass.  ?   Tenderness: There is no abdominal tenderness. There is no guarding.  ?Skin: ?   General: Skin is warm and dry.  ?   Coloration: Skin is not jaundiced.  ?   Findings: No erythema.  ?Neurological:  ?   Mental Status: She is alert.  ? ? ?Results for orders placed or performed in visit on 05/12/21  ?Bayer DCA Hb A1c Waived (STAT)  ?Result Value Ref Range  ? HB A1C (BAYER DCA - WAIVED) 7.8 (H) 4.8 - 5.6 %   ?Microalbumin, Urine Waived  ?Result Value Ref Range  ? Microalb, Ur Waived 10 0 - 19 mg/L  ? Creatinine, Urine Waived 50 10 - 300 mg/dL  ? Microalb/Creat Ratio <30 <30 mg/g  ? ?   ? ? ?Current Outpatient Medications:  ?  amLODipine (NORVASC) 5 MG tablet, TAKE 1 TABLET BY MOUTH  DAILY, Disp: 90 tablet, Rfl: 3 ?  Blood Glucose Monitoring Suppl (FIFTY50 GLUCOSE METER 2.0) w/Device KIT, , Disp: , Rfl:  ?  glucose blood test strip, , Disp: , Rfl:  ?  Lancets MISC, , Disp: , Rfl:  ?  lisinopril (ZESTRIL) 40 MG tablet, Take 1 tablet (40 mg total) by mouth daily., Disp: 90 tablet, Rfl: 0 ?  metFORMIN (GLUCOPHAGE) 500 MG tablet, Take 1 tablet (500 mg total) by mouth 2 (two) times daily with a meal., Disp: 60 tablet, Rfl: 3 ?  empagliflozin (JARDIANCE) 10 MG TABS tablet, Take 3 tablets (30 mg total) by mouth daily., Disp: 90 tablet, Rfl: 1 ?  valACYclovir (VALTREX) 500 MG tablet, Take 1 tablet (500 mg total) by mouth daily., Disp: 30 tablet,  Rfl: 0 ? ?Current Facility-Administered Medications:  ?  0.9 %  sodium chloride infusion, 500 mL, Intravenous, Once, Mansouraty, Telford Nab., MD  ? ? ?Assessment & Plan:  ?Diabetes a1c at 7.8  ?Is on jardiance ,metformin 500 mg bid ?check HbA1c,  urine  microalbumin  diabetic diet plan given to pt  adviced regarding hypoglycemia and instructions given to pt today on how to prevent and treat the same if it were to occur. pt acknowledges the plan and voices understanding of the same.  exercise plan given and encouraged.   advice diabetic yearly podiatry, ophthalmology , nutritionist , dental check q 6 months, ? ?Oral ulcers was on valtrex which helps such will rx for prophylaxis. ? ?Problem List Items Addressed This Visit   ?None ?Visit Diagnoses   ? ? Type 2 diabetes mellitus with other specified complication, without long-term current use of insulin ([place])    -  Primary  ? Relevant Medications  ? empagliflozin (JARDIANCE) 10 MG TABS tablet  ? Other Relevant Orders  ? CBC with  Differential/Platelet  ? Comprehensive metabolic panel  ? Lipid panel  ? TSH  ? Bayer DCA Hb A1c Waived (STAT)  ? Herpes simplex infection      ? Relevant Medications  ? valACYclovir (VALTREX) 500 MG tablet  ? ?  ?  ? ?Orders Placed This Encounter  ?Procedures  ? CBC with Differential/Platelet  ? Comprehensive metabolic panel  ? Lipid panel  ? TSH  ? Bayer DCA Hb A1c Waived (STAT)  ?  ? ?Meds ordered this encounter  ?Medications  ? valACYclovir (VALTREX) 500 MG tablet  ?  Sig: Take 1 tablet (500 mg total) by mouth daily.  ?  Dispense:  30 tablet  ?  Refill:  0  ? empagliflozin (JARDIANCE) 10 MG TABS tablet  ?  Sig: Take 3 tablets (30 mg total) by mouth daily.  ?  Dispense:  90 tablet  ?  Refill:  1  ?  ? ?Follow up plan: ?Return in about 3 months (around 09/02/2021). ? ? ? ?

## 2021-07-14 ENCOUNTER — Other Ambulatory Visit: Payer: Self-pay | Admitting: Internal Medicine

## 2021-07-14 DIAGNOSIS — I10 Essential (primary) hypertension: Secondary | ICD-10-CM

## 2021-07-14 DIAGNOSIS — B009 Herpesviral infection, unspecified: Secondary | ICD-10-CM

## 2021-07-14 MED ORDER — LISINOPRIL 40 MG PO TABS: 40.0000 mg | ORAL_TABLET | Freq: Every day | ORAL | 0 refills | Status: DC

## 2021-07-14 MED ORDER — VALACYCLOVIR HCL 500 MG PO TABS: 500.0000 mg | ORAL_TABLET | Freq: Every day | ORAL | 0 refills | Status: DC

## 2021-07-14 NOTE — Telephone Encounter (Signed)
Requested Prescriptions  ?Pending Prescriptions Disp Refills  ?? lisinopril (ZESTRIL) 40 MG tablet 90 tablet 0  ?  Sig: Take 1 tablet (40 mg total) by mouth daily.  ?  ? Cardiovascular:  ACE Inhibitors Failed - 07/14/2021  3:30 PM  ?  ?  Failed - Cr in normal range and within 180 days  ?  Creatinine, Ser  ?Date Value Ref Range Status  ?09/11/2019 0.86 0.57 - 1.00 mg/dL Final  ?   ?  ?  Failed - K in normal range and within 180 days  ?  Potassium  ?Date Value Ref Range Status  ?09/11/2019 4.3 3.5 - 5.2 mmol/L Final  ?   ?  ?  Failed - Last BP in normal range  ?  BP Readings from Last 1 Encounters:  ?06/02/21 (!) 158/73  ?   ?  ?  Passed - Patient is not pregnant  ?  ?  Passed - Valid encounter within last 6 months  ?  Recent Outpatient Visits   ?      ? 1 month ago Type 2 diabetes mellitus with other specified complication, without long-term current use of insulin (HCC)  ? [facility] Vigg, Avanti, MD  ? 2 months ago Diabetes mellitus without complication (HCC)  ? [facility] Vigg, Avanti, MD  ?  ?  ?Future Appointments   ?        ? In 1 month Mecum, Erin E, PA-C [facility]  ?  ? ?  ?  ?  ?? valACYclovir (VALTREX) 500 MG tablet 90 tablet 0  ?  Sig: Take 1 tablet (500 mg total) by mouth daily.  ?  ? Antimicrobials:  Antiviral Agents - Anti-Herpetic Passed - 07/14/2021  3:30 PM  ?  ?  Passed - Valid encounter within last 12 months  ?  Recent Outpatient Visits   ?      ? 1 month ago Type 2 diabetes mellitus with other specified complication, without long-term current use of insulin (HCC)  ? [facility] Vigg, Avanti, MD  ? 2 months ago Diabetes mellitus without complication (HCC)  ? [facility] Vigg, Avanti, MD  ?  ?  ?Future Appointments   ?        ? In 1 month Mecum, Oswaldo Conroy, PA-C [facility], [facility]  ?  ? ?  ?  ?  ? ? ?

## 2021-07-14 NOTE — Telephone Encounter (Signed)
Requested medication (s) are due for refill today: yes ? ?Requested medication (s) are on the active medication list: yes ? ?Last refill:  04/19/21 #90/0 ? ?Future visit scheduled: yes ? ?Notes to clinic:  Unable to refill per protocol due to failed labs, no updated results. ? ? ?  ?Requested Prescriptions  ?Pending Prescriptions Disp Refills  ? lisinopril (ZESTRIL) 40 MG tablet 90 tablet 0  ?  Sig: Take 1 tablet (40 mg total) by mouth daily.  ?  ? Cardiovascular:  ACE Inhibitors Failed - 07/14/2021  3:30 PM  ?  ?  Failed - Cr in normal range and within 180 days  ?  Creatinine, Ser  ?Date Value Ref Range Status  ?09/11/2019 0.86 0.57 - 1.00 mg/dL Final  ?  ?  ?  ?  Failed - K in normal range and within 180 days  ?  Potassium  ?Date Value Ref Range Status  ?09/11/2019 4.3 3.5 - 5.2 mmol/L Final  ?  ?  ?  ?  Failed - Last BP in normal range  ?  BP Readings from Last 1 Encounters:  ?06/02/21 (!) 158/73  ?  ?  ?  ?  Passed - Patient is not pregnant  ?  ?  Passed - Valid encounter within last 6 months  ?  Recent Outpatient Visits   ? ?      ? 1 month ago Type 2 diabetes mellitus with other specified complication, without long-term current use of insulin (HCC)  ? [facility] Vigg, Avanti, MD  ? 2 months ago Diabetes mellitus without complication (HCC)  ? [facility] Vigg, Avanti, MD  ? ?  ?  ?Future Appointments   ? ?        ? In 1 month Mecum, Oswaldo Conroy, PA-C [employer] [facility]  ? ?  ? ? ?  ?  ?  ?Signed Prescriptions Disp Refills  ? valACYclovir (VALTREX) 500 MG tablet 90 tablet 0  ?  Sig: Take 1 tablet (500 mg total) by mouth daily.  ?  ? Antimicrobials:  Antiviral Agents - Anti-Herpetic Passed - 07/14/2021  3:30 PM  ?  ?  Passed - Valid encounter within last 12 months  ?  Recent Outpatient Visits   ? ?      ? 1 month ago Type 2 diabetes mellitus with other specified complication, without long-term current use of insulin (HCC)  ? [facility] Vigg, Avanti, MD  ? 2 months ago  Diabetes mellitus without complication (HCC)  ? [facility] Vigg, Avanti, MD  ? ?  ?  ?Future Appointments   ? ?        ? In 1 month Mecum, Oswaldo Conroy, PA-C [employer] [facility]  ? ?  ? ? ?  ?  ?  ? ?

## 2021-07-14 NOTE — Telephone Encounter (Signed)
Medication Refill - Medication: lisinopril (ZESTRIL) 40 MG tablet ?valACYclovir (VALTREX) 500 MG tablet 30 tablet 0 06/02/2021  ? ? ?Has the patient contacted their pharmacy? Yes.   ?(Agent: If no, request that the patient contact the pharmacy for the refill. If patient does not wish to contact the pharmacy document the reason why and proceed with request.) ?(Agent: If yes, when and what did the pharmacy advise?) ? ?Preferred Pharmacy (with phone number or street name):  ?[id number] Nicholes Rough, [place] - [address] AT [facility] OF [address]  ?[address] [place] [address]  ?Phone: [phone] Fax: [phone]  ?Hours: Not open 24 hours  ? ?Has the patient been seen for an appointment in the last year OR does the patient have an upcoming appointment? Yes.   ? ?Agent: Please be advised that RX refills may take up to 3 business days. We ask that you follow-up with your pharmacy. ? ?

## 2021-07-28 ENCOUNTER — Telehealth: Payer: Self-pay

## 2021-07-28 ENCOUNTER — Other Ambulatory Visit: Payer: Self-pay

## 2021-07-28 MED ORDER — EMPAGLIFLOZIN 10 MG PO TABS: 25.0000 mg | ORAL_TABLET | Freq: Every day | ORAL | 1 refills | Status: DC

## 2021-07-28 NOTE — Telephone Encounter (Signed)
Error

## 2021-09-05 ENCOUNTER — Ambulatory Visit: Payer: Managed Care, Other (non HMO) | Admitting: Physician Assistant

## 2021-09-05 ENCOUNTER — Encounter: Payer: Self-pay | Admitting: Physician Assistant

## 2021-09-05 VITALS — BP 136/85 | HR 92 | Temp 98.4°F | Ht 63.19 in | Wt 136.4 lb

## 2021-09-05 DIAGNOSIS — B009 Herpesviral infection, unspecified: Secondary | ICD-10-CM

## 2021-09-05 DIAGNOSIS — I10 Essential (primary) hypertension: Secondary | ICD-10-CM

## 2021-09-05 DIAGNOSIS — E1169 Type 2 diabetes mellitus with other specified complication: Secondary | ICD-10-CM | POA: Diagnosis not present

## 2021-09-05 DIAGNOSIS — E782 Mixed hyperlipidemia: Secondary | ICD-10-CM

## 2021-09-05 DIAGNOSIS — E119 Type 2 diabetes mellitus without complications: Secondary | ICD-10-CM

## 2021-09-05 LAB — BAYER DCA HB A1C WAIVED: HB A1C (BAYER DCA - WAIVED): 7.7 % — ABNORMAL HIGH (ref 4.8–5.6)

## 2021-09-05 LAB — HM DIABETES EYE EXAM

## 2021-09-05 MED ORDER — METFORMIN HCL 500 MG PO TABS: 500.0000 mg | ORAL_TABLET | Freq: Two times a day (BID) | ORAL | 3 refills | Status: DC

## 2021-09-05 MED ORDER — ROSUVASTATIN CALCIUM 10 MG PO TABS: 10.0000 mg | ORAL_TABLET | Freq: Every day | ORAL | 3 refills | Status: DC

## 2021-09-05 MED ORDER — VALACYCLOVIR HCL 500 MG PO TABS: 500.0000 mg | ORAL_TABLET | Freq: Every day | ORAL | 0 refills | Status: DC

## 2021-09-05 MED ORDER — EMPAGLIFLOZIN 10 MG PO TABS: 20.0000 mg | ORAL_TABLET | Freq: Every day | ORAL | 1 refills | Status: DC

## 2021-09-05 MED ORDER — LISINOPRIL 40 MG PO TABS: 40.0000 mg | ORAL_TABLET | Freq: Every day | ORAL | 1 refills | Status: DC

## 2021-09-06 ENCOUNTER — Encounter: Payer: Self-pay | Admitting: Internal Medicine

## 2021-09-06 DIAGNOSIS — B009 Herpesviral infection, unspecified: Secondary | ICD-10-CM | POA: Insufficient documentation

## 2021-09-06 LAB — CBC WITH DIFFERENTIAL/PLATELET
Basophils Absolute: 0.1 10*3/uL (ref 0.0–0.2)
Basos: 1 %
EOS (ABSOLUTE): 0.4 10*3/uL (ref 0.0–0.4)
Eos: 4 %
Hematocrit: 39.4 % (ref 34.0–46.6)
Hemoglobin: 13.2 g/dL (ref 11.1–15.9)
Immature Grans (Abs): 0.1 10*3/uL (ref 0.0–0.1)
Immature Granulocytes: 1 %
Lymphocytes Absolute: 3.5 10*3/uL — ABNORMAL HIGH (ref 0.7–3.1)
Lymphs: 38 %
MCH: 30.1 pg (ref 26.6–33.0)
MCHC: 33.5 g/dL (ref 31.5–35.7)
MCV: 90 fL (ref 79–97)
Monocytes Absolute: 0.6 10*3/uL (ref 0.1–0.9)
Monocytes: 6 %
Neutrophils Absolute: 4.6 10*3/uL (ref 1.4–7.0)
Neutrophils: 50 %
Platelets: 310 10*3/uL (ref 150–450)
RBC: 4.38 x10E6/uL (ref 3.77–5.28)
RDW: 12.9 % (ref 11.7–15.4)
WBC: 9.2 10*3/uL (ref 3.4–10.8)

## 2021-09-06 LAB — COMPREHENSIVE METABOLIC PANEL
ALT: 16 IU/L (ref 0–32)
AST: 18 IU/L (ref 0–40)
Albumin/Globulin Ratio: 1.9 (ref 1.2–2.2)
Albumin: 4.5 g/dL (ref 3.8–4.9)
Alkaline Phosphatase: 86 IU/L (ref 44–121)
BUN/Creatinine Ratio: 17 (ref 9–23)
BUN: 19 mg/dL (ref 6–24)
Bilirubin Total: 0.2 mg/dL (ref 0.0–1.2)
CO2: 26 mmol/L (ref 20–29)
Calcium: 10.1 mg/dL (ref 8.7–10.2)
Chloride: 101 mmol/L (ref 96–106)
Creatinine, Ser: 1.11 mg/dL — ABNORMAL HIGH (ref 0.57–1.00)
Globulin, Total: 2.4 g/dL (ref 1.5–4.5)
Glucose: 163 mg/dL — ABNORMAL HIGH (ref 70–99)
Potassium: 4.4 mmol/L (ref 3.5–5.2)
Sodium: 141 mmol/L (ref 134–144)
Total Protein: 6.9 g/dL (ref 6.0–8.5)
eGFR: 60 mL/min/{1.73_m2} (ref >59)

## 2021-09-06 LAB — TSH: TSH: 1.26 u[IU]/mL (ref 0.450–4.500)

## 2021-09-12 ENCOUNTER — Encounter: Payer: Self-pay | Admitting: Family

## 2021-09-12 ENCOUNTER — Ambulatory Visit: Payer: Managed Care, Other (non HMO) | Admitting: Family

## 2021-09-12 ENCOUNTER — Other Ambulatory Visit: Payer: Self-pay | Admitting: Family

## 2021-09-12 VITALS — BP 134/66 | HR 71 | Temp 98.4°F | Resp 16 | Ht 63.25 in | Wt 133.2 lb

## 2021-09-12 DIAGNOSIS — E1169 Type 2 diabetes mellitus with other specified complication: Secondary | ICD-10-CM | POA: Diagnosis not present

## 2021-09-12 DIAGNOSIS — I10 Essential (primary) hypertension: Secondary | ICD-10-CM

## 2021-09-12 DIAGNOSIS — Z1231 Encounter for screening mammogram for malignant neoplasm of breast: Secondary | ICD-10-CM | POA: Diagnosis not present

## 2021-09-12 DIAGNOSIS — E559 Vitamin D deficiency, unspecified: Secondary | ICD-10-CM | POA: Diagnosis not present

## 2021-09-12 DIAGNOSIS — Z23 Encounter for immunization: Secondary | ICD-10-CM | POA: Diagnosis not present

## 2021-09-12 DIAGNOSIS — E782 Mixed hyperlipidemia: Secondary | ICD-10-CM | POA: Diagnosis not present

## 2021-09-12 DIAGNOSIS — B009 Herpesviral infection, unspecified: Secondary | ICD-10-CM

## 2021-09-12 DIAGNOSIS — E119 Type 2 diabetes mellitus without complications: Secondary | ICD-10-CM

## 2021-09-12 MED ORDER — SITAGLIPTIN PHOSPHATE 50 MG PO TABS: 50.0000 mg | ORAL_TABLET | Freq: Every day | ORAL | 1 refills | Status: DC

## 2021-09-12 NOTE — Assessment & Plan Note (Signed)
Ordered vitamin d pending results.   

## 2021-09-12 NOTE — Assessment & Plan Note (Signed)
Continue amlodipine and lisinopril as prescribed.

## 2021-09-12 NOTE — Assessment & Plan Note (Signed)
Continue daily valtrex for suppression.

## 2021-09-12 NOTE — Assessment & Plan Note (Addendum)
Ordered lipid panel, pending results. Work on low cholesterol diet and exercise as tolerated Stop pravastatin start crestor, already RX in last week.

## 2021-09-12 NOTE — Progress Notes (Signed)
Established Patient Office Visit  Subjective:  Patient ID: Lori Beasley, female    DOB: [birth year]  Age: 51 y.o. MRN: [id number]  CC:  Chief Complaint  Patient presents with   Establish Care    HPI Lori Beasley is here for a transition of care visit.  Prior provider was: .Tor Netters, FNP , was going temporarily to [facility].  Pt is without acute concerns.   Pap 2019, negative.  Colonoscopy: every ten years, last 08/13/20  chronic concerns:  Internal hemorrhoids on last colonoscopy: asymptomatic.   DM2: was on metformin 500 mg twice daily and also on jardiance, but having issues with insurance. Also had UTI's so didn't tolerate this medication well. Doesn't tolerate higher doses that well with metformin. With jardiance was given her recurrent UTI's so stopped. Had been on glipizide in the past but this was d/c.  Lab Results  Component Value Date   HGBA1C 7.7 (H) 09/05/2021   Hyperlipidemia: ran out of rosuvastatin, so started taking leftover pravastatin. Did renew rosuvastatin and now with active prescription. She is going to start again her crestor as she believes she was switched bc LDL was still not at goal <70   HTN: tolerating well, amlodipine 5 mg and lisinopril 40 mg. Today blood pressure ok. Doesn't check blodo pressure at home.   Past Medical History:  Diagnosis Date   COVID-19 virus infection    Diabetes ([place])    Hyperlipidemia    Hypertension     Past Surgical History:  Procedure Laterality Date   CESAREAN SECTION     HAND SURGERY     bil cyst removed from hands    Family History  Problem Relation Age of Onset   Thyroid disease Mother    Heart disease Father    Colon polyps Father    Colon polyps Brother    Breast cancer Maternal Grandmother        over 26   Colon cancer Neg Hx    Esophageal cancer Neg Hx    Rectal cancer Neg Hx    Stomach cancer Neg Hx     Social History   Socioeconomic History   Marital status:  Significant Other    Spouse name: Not on file   Number of children: 2   Years of education: Not on file   Highest education level: Not on file  Occupational History    Employer: [employer]  Tobacco Use   Smoking status: Never   Smokeless tobacco: Never  Vaping Use   Vaping Use: Never used  Substance and Sexual Activity   Alcohol use: Yes    Comment: occ rare    Drug use: No   Sexual activity: Yes    Birth control/protection: None  Other Topics Concern   Not on file  Social History Narrative   Not on file   Social Determinants of Health   Financial Resource Strain: Not on file  Food Insecurity: Not on file  Transportation Needs: Not on file  Physical Activity: Not on file  Stress: Not on file  Social Connections: Not on file  Intimate Partner Violence: Not on file    Outpatient Medications Prior to Visit  Medication Sig Dispense Refill   amLODipine (NORVASC) 5 MG tablet TAKE 1 TABLET BY MOUTH  DAILY 90 tablet 3   Blood Glucose Monitoring Suppl (FIFTY50 GLUCOSE METER 2.0) w/Device KIT      glucose blood test strip      Lancets MISC  lisinopril (ZESTRIL) 40 MG tablet Take 1 tablet (40 mg total) by mouth daily. 90 tablet 1   metFORMIN (GLUCOPHAGE) 500 MG tablet Take 1 tablet (500 mg total) by mouth 2 (two) times daily with a meal. 60 tablet 3   rosuvastatin (CRESTOR) 10 MG tablet Take 1 tablet (10 mg total) by mouth at bedtime. 90 tablet 3   valACYclovir (VALTREX) 500 MG tablet Take 1 tablet (500 mg total) by mouth daily. 90 tablet 0   empagliflozin (JARDIANCE) 10 MG TABS tablet Take 2 tablets (20 mg total) by mouth daily. 90 tablet 1   0.9 %  sodium chloride infusion      No facility-administered medications prior to visit.    Allergies  Allergen Reactions   Jardiance [Empagliflozin]     uti's    ROS Review of Systems  Review of Systems  Respiratory:  Negative for shortness of breath.   Cardiovascular:  Negative for chest pain and palpitations.   Gastrointestinal:  Negative for constipation and diarrhea.  Genitourinary:  Negative for dysuria, frequency and urgency.  Musculoskeletal:  Negative for myalgias.  Psychiatric/Behavioral:  Negative for depression and suicidal ideas.   All other systems reviewed and are negative.    Objective:    Physical Exam  Gen: NAD, resting comfortably CV: RRR with no murmurs appreciated Pulm: NWOB, CTAB with no crackles, wheezes, or rhonchi Skin: warm, dry Psych: Normal affect and thought content  BP 134/66   Pulse 71   Temp 98.4 F (36.9 C)   Resp 16   Ht 5' 3.25" (1.607 m)   Wt 133 lb 4 oz (60.4 kg)   LMP 07/13/2017   SpO2 99%   BMI 23.42 kg/m  Wt Readings from Last 3 Encounters:  09/12/21 133 lb 4 oz (60.4 kg)  09/05/21 136 lb 6.4 oz (61.9 kg)  06/02/21 138 lb (62.6 kg)     Health Maintenance Due  Topic Date Due   Zoster Vaccines- Shingrix (1 of 2) Never done   TETANUS/TDAP  02/08/2020   PAP SMEAR-Modifier  02/25/2021   MAMMOGRAM  06/08/2021    There are no preventive care reminders to display for this patient.  Lab Results  Component Value Date   TSH 1.260 09/05/2021   Lab Results  Component Value Date   WBC 9.2 09/05/2021   HGB 13.2 09/05/2021   HCT 39.4 09/05/2021   MCV 90 09/05/2021   PLT 310 09/05/2021   Lab Results  Component Value Date   NA 141 09/05/2021   K 4.4 09/05/2021   CO2 26 09/05/2021   GLUCOSE 163 (H) 09/05/2021   BUN 19 09/05/2021   CREATININE 1.11 (H) 09/05/2021   BILITOT 0.2 09/05/2021   ALKPHOS 86 09/05/2021   AST 18 09/05/2021   ALT 16 09/05/2021   PROT 6.9 09/05/2021   ALBUMIN 4.5 09/05/2021   CALCIUM 10.1 09/05/2021   EGFR 60 09/05/2021   GFR 68.75 08/22/2017   Lab Results  Component Value Date   CHOL 188 09/11/2019   Lab Results  Component Value Date   HDL 82 09/11/2019   Lab Results  Component Value Date   LDLCALC 94 09/11/2019   Lab Results  Component Value Date   TRIG 64 09/11/2019   Lab Results   Component Value Date   CHOLHDL 2.3 09/11/2019   Lab Results  Component Value Date   HGBA1C 7.7 (H) 09/05/2021      Assessment & Plan:   Problem List Items Addressed This Visit  Cardiovascular and Mediastinum   Essential hypertension    Continue amlodipine and lisinopril as prescribed.         Endocrine   Diabetes mellitus without complication ([place])    Reviewed a1c. Stop jardiance, start [place]. Advised to let me know if too expensive can consider GLP1 injectables and or glipizide due to cost. Work on diabetic diet and exercise as tolerated. Yearly foot exam, and annual eye exam.        Relevant Medications   sitaGLIPtin (JANUVIA) 50 MG tablet   RESOLVED: Diabetes mellitus ([place]) - Primary   Relevant Medications   sitaGLIPtin (JANUVIA) 50 MG tablet     Other   Hyperlipidemia    Ordered lipid panel, pending results. Work on low cholesterol diet and exercise as tolerated Stop pravastatin start crestor, already RX in last week.       Relevant Orders   Lipid panel   Herpes simplex infection    Continue daily valtrex for suppression.      Encounter for vaccination    TDAP vaccine administered in office Pt tolerated procedure well  Verbal consent obtained prior to administration  Handout given in regards to vaccination.        Relevant Orders   Tdap vaccine greater than or equal to 7yo IM   Encounter for screening mammogram for malignant neoplasm of breast    Mammogram ordered. Pending results.       Relevant Orders   MM DIAG BREAST TOMO BILATERAL   Vitamin D deficiency    Ordered vitamin d pending results.        Relevant Orders   VITAMIN D 25 Hydroxy (Vit-D Deficiency, Fractures)    Meds ordered this encounter  Medications   sitaGLIPtin (JANUVIA) 50 MG tablet    Sig: Take 1 tablet (50 mg total) by mouth daily.    Dispense:  90 tablet    Refill:  1    Order Specific Question:   Supervising Provider    Answer:   Diona Browner, AMY E [[id number]]     Follow-up: Return in about 3 months (around 12/13/2021) for schedule pap smear .    Eugenia Pancoast, FNP

## 2021-09-12 NOTE — Assessment & Plan Note (Signed)
Reviewed a1c. Stop jardiance, start [place]. Advised to let me know if too expensive can consider GLP1 injectables and or glipizide due to cost. Work on diabetic diet and exercise as tolerated. Yearly foot exam, and annual eye exam.

## 2021-09-12 NOTE — Assessment & Plan Note (Signed)
Mammogram ordered. Pending results. 

## 2021-09-12 NOTE — Assessment & Plan Note (Signed)
TDAP  vaccine administered in office Pt tolerated procedure well  Verbal consent obtained prior to administration  Handout given in regards to vaccination.   

## 2021-09-12 NOTE — Patient Instructions (Addendum)
Call [facility] to schedule your mammogram as I have sent the electronic order to their facility.  Here is their number : [phone]   Welcome to our clinic, I am happy to have you as my new patient. I am excited to continue on this healthcare journey with you.  Stop by the lab prior to leaving today. I will notify you of your results once received.   Please keep in mind Any my chart messages you send have up to a three business day turnaround for a response.  Phone calls may take up to a one full business day turnaround for a  response.   If you need a medication refill I recommend you request it through the pharmacy as this is easiest for [place] rather than sending a message and or phone call.   Due to recent changes in healthcare laws, you may see results of your imaging and/or laboratory studies on MyChart before I have had a chance to review them.  I understand that in some cases there may be results that are confusing or concerning to you. Please understand that not all results are received at the same time and often I may need to interpret multiple results in order to provide you with the best plan of care or course of treatment. Therefore, I ask that you please give me 2 business days to thoroughly review all your results before contacting my office for clarification. Should we see a critical lab result, you will be contacted sooner.   It was a pleasure seeing you today! Please do not hesitate to reach out with any questions and or concerns.  Regards,   Mort Sawyers FNP-C

## 2021-09-13 LAB — LIPID PANEL
Chol/HDL Ratio: 2.3 ratio (ref 0.0–4.4)
Cholesterol, Total: 198 mg/dL (ref 100–199)
HDL: 88 mg/dL (ref >39)
LDL Chol Calc (NIH): 99 mg/dL (ref 0–99)
Triglycerides: 62 mg/dL (ref 0–149)
VLDL Cholesterol Cal: 11 mg/dL (ref 5–40)

## 2021-09-13 LAB — VITAMIN D 25 HYDROXY (VIT D DEFICIENCY, FRACTURES): Vit D, 25-Hydroxy: 64.7 ng/mL (ref 30.0–100.0)

## 2021-09-14 NOTE — Progress Notes (Signed)
Cholesterol and vitamin d normal limits.

## 2021-10-05 ENCOUNTER — Ambulatory Visit
Admission: RE | Admit: 2021-10-05 | Discharge: 2021-10-05 | Disposition: A | Discharge status: Home / Self Care | Payer: Managed Care, Other (non HMO) | Source: Ambulatory Visit | Attending: Family | Admitting: Family

## 2021-10-05 DIAGNOSIS — Z1231 Encounter for screening mammogram for malignant neoplasm of breast: Secondary | ICD-10-CM | POA: Insufficient documentation

## 2021-10-07 NOTE — Progress Notes (Signed)
Mammogram came back without suspicious findings for malignancy. Noted.

## 2021-10-30 ENCOUNTER — Telehealth: Payer: Self-pay | Admitting: Physician Assistant

## 2021-10-30 DIAGNOSIS — E1169 Type 2 diabetes mellitus with other specified complication: Secondary | ICD-10-CM

## 2021-11-01 NOTE — Telephone Encounter (Signed)
Not a pt here at [facility] .

## 2021-11-01 NOTE — Telephone Encounter (Signed)
pt now a pt of [place] at [facility]- no longer under prescribers care  Requested Prescriptions  Refused Prescriptions Disp Refills  . metFORMIN (GLUCOPHAGE) 500 MG tablet [Pharmacy Med Name: metFORMIN HCl 500 MG Oral Tablet] 180 tablet 3    Sig: TAKE 1 TABLET BY MOUTH TWICE  DAILY WITH MEALS     Endocrinology:  Diabetes - Biguanides Failed - 10/30/2021 11:12 PM      Failed - Cr in normal range and within 360 days    Creatinine, Ser  Date Value Ref Range Status  09/05/2021 1.11 (H) 0.57 - 1.00 mg/dL Final         Failed - B12 Level in normal range and within 720 days    No results found for: "VITAMINB12"       Passed - HBA1C is between 0 and 7.9 and within 180 days    HB A1C (BAYER DCA - WAIVED)  Date Value Ref Range Status  09/05/2021 7.7 (H) 4.8 - 5.6 % Final    Comment:             Prediabetes: 5.7 - 6.4          Diabetes: >6.4          Glycemic control for adults with diabetes: <7.0          Passed - eGFR in normal range and within 360 days    GFR calc Af Amer  Date Value Ref Range Status  09/11/2019 91 >59 mL/min/1.73 Final    Comment:    **[facility] currently reports eGFR in compliance with the current**   recommendations of the [employer]. [facility] will   update reporting as new guidelines are published from the NKF-ASN   Task force.    GFR calc non Af Amer  Date Value Ref Range Status  09/11/2019 79 >59 mL/min/1.73 Final   GFR  Date Value Ref Range Status  08/22/2017 68.75 >60.00 mL/min Final   eGFR  Date Value Ref Range Status  09/05/2021 60 >59 mL/min/1.73 Final         Passed - Valid encounter within last 6 months    Recent Outpatient Visits          1 month ago Mixed hyperlipidemia   [facility] Mecum, Erin E, PA-C   5 months ago Type 2 diabetes mellitus with other specified complication, without long-term current use of insulin ([place])   [place] Vigg, Avanti, MD   5 months ago Diabetes mellitus without  complication ([place])   [place], MD      Future Appointments            In 1 month Kathrine Haddock, NP [employer], [place]   In 1 month Dugal, Tabitha, [place] [facility] [place], [place] within normal limits and completed in the last 12 months    WBC  Date Value Ref Range Status  09/05/2021 9.2 3.4 - 10.8 x10E3/uL Final  08/22/2017 9.1 4.0 - 10.5 K/uL Final   RBC  Date Value Ref Range Status  09/05/2021 4.38 3.77 - 5.28 x10E6/uL Final  08/22/2017 4.47 3.87 - 5.11 Mil/uL Final   Hemoglobin  Date Value Ref Range Status  09/05/2021 13.2 11.1 - 15.9 g/dL Final   Hematocrit  Date Value Ref Range Status  09/05/2021 39.4 34.0 - 46.6 % Final   MCHC  Date Value Ref Range  Status  09/05/2021 33.5 31.5 - 35.7 g/dL Final  08/22/2017 33.4 30.0 - 36.0 g/dL Final   [facility]  Date Value Ref Range Status  09/05/2021 30.1 26.6 - 33.0 pg Final   MCV  Date Value Ref Range Status  09/05/2021 90 79 - 97 fL Final   No results found for: "PLTCOUNTKUC", "LABPLAT", "POCPLA" RDW  Date Value Ref Range Status  09/05/2021 12.9 11.7 - 15.4 % Final

## 2021-11-03 ENCOUNTER — Other Ambulatory Visit: Payer: Self-pay | Admitting: Family

## 2021-11-03 DIAGNOSIS — I10 Essential (primary) hypertension: Secondary | ICD-10-CM

## 2021-12-06 ENCOUNTER — Ambulatory Visit: Payer: Managed Care, Other (non HMO) | Admitting: Unknown Physician Specialty

## 2021-12-14 ENCOUNTER — Other Ambulatory Visit: Payer: Self-pay | Admitting: Physician Assistant

## 2021-12-14 DIAGNOSIS — E1169 Type 2 diabetes mellitus with other specified complication: Secondary | ICD-10-CM

## 2021-12-15 NOTE — Telephone Encounter (Signed)
pt no longer a pt at [facility]  Requested Prescriptions  Refused Prescriptions Disp Refills  . metFORMIN (GLUCOPHAGE) 500 MG tablet [Pharmacy Med Name: metFORMIN HCl 500 MG Oral Tablet] 60 tablet 11    Sig: TAKE 1 TABLET BY MOUTH TWICE  DAILY WITH MEALS     Endocrinology:  Diabetes - Biguanides Failed - 12/14/2021 10:04 PM      Failed - Cr in normal range and within 360 days    Creatinine, Ser  Date Value Ref Range Status  09/05/2021 1.11 (H) 0.57 - 1.00 mg/dL Final         Failed - B12 Level in normal range and within 720 days    No results found for: "VITAMINB12"       Passed - HBA1C is between 0 and 7.9 and within 180 days    HB A1C (BAYER DCA - WAIVED)  Date Value Ref Range Status  09/05/2021 7.7 (H) 4.8 - 5.6 % Final    Comment:             Prediabetes: 5.7 - 6.4          Diabetes: >6.4          Glycemic control for adults with diabetes: <7.0          Passed - eGFR in normal range and within 360 days    GFR calc Af Amer  Date Value Ref Range Status  09/11/2019 91 >59 mL/min/1.73 Final    Comment:    **[facility] currently reports eGFR in compliance with the current**   recommendations of the [employer]. [facility] will   update reporting as new guidelines are published from the NKF-ASN   Task force.    GFR calc non Af Amer  Date Value Ref Range Status  09/11/2019 79 >59 mL/min/1.73 Final   GFR  Date Value Ref Range Status  08/22/2017 68.75 >60.00 mL/min Final   eGFR  Date Value Ref Range Status  09/05/2021 60 >59 mL/min/1.73 Final         Passed - Valid encounter within last 6 months    Recent Outpatient Visits          3 months ago Mixed hyperlipidemia   [facility] Mecum, Erin E, PA-C   6 months ago Type 2 diabetes mellitus with other specified complication, without long-term current use of insulin (HCC)   [facility] Vigg, Avanti, MD   7 months ago Diabetes mellitus without complication (HCC)    [facility] Vigg, Avanti, MD      Future Appointments            Tomorrow Dugal, Wyatt Mage, FNP [facility] [place]           Passed - CBC within normal limits and completed in the last 12 months    WBC  Date Value Ref Range Status  09/05/2021 9.2 3.4 - 10.8 x10E3/uL Final  08/22/2017 9.1 4.0 - 10.5 K/uL Final   RBC  Date Value Ref Range Status  09/05/2021 4.38 3.77 - 5.28 x10E6/uL Final  08/22/2017 4.47 3.87 - 5.11 Mil/uL Final   Hemoglobin  Date Value Ref Range Status  09/05/2021 13.2 11.1 - 15.9 g/dL Final   Hematocrit  Date Value Ref Range Status  09/05/2021 39.4 34.0 - 46.6 % Final   MCHC  Date Value Ref Range Status  09/05/2021 33.5 31.5 - 35.7 g/dL Final  60/92/5663 50.3 30.0 - 36.0 g/dL Final  [facility]  Date Value Ref Range Status  09/05/2021 30.1 26.6 - 33.0 pg Final   MCV  Date Value Ref Range Status  09/05/2021 90 79 - 97 fL Final   No results found for: "PLTCOUNTKUC", "LABPLAT", "POCPLA" RDW  Date Value Ref Range Status  09/05/2021 12.9 11.7 - 15.4 % Final

## 2021-12-16 ENCOUNTER — Ambulatory Visit: Payer: Managed Care, Other (non HMO) | Admitting: Family

## 2021-12-16 ENCOUNTER — Encounter: Payer: Self-pay | Admitting: Family

## 2021-12-16 VITALS — BP 128/78 | HR 95 | Temp 98.3°F | Resp 16 | Ht 63.25 in | Wt 132.0 lb

## 2021-12-16 DIAGNOSIS — E782 Mixed hyperlipidemia: Secondary | ICD-10-CM

## 2021-12-16 DIAGNOSIS — Z23 Encounter for immunization: Secondary | ICD-10-CM | POA: Diagnosis not present

## 2021-12-16 DIAGNOSIS — B009 Herpesviral infection, unspecified: Secondary | ICD-10-CM | POA: Diagnosis not present

## 2021-12-16 DIAGNOSIS — E119 Type 2 diabetes mellitus without complications: Secondary | ICD-10-CM

## 2021-12-16 DIAGNOSIS — I10 Essential (primary) hypertension: Secondary | ICD-10-CM

## 2021-12-16 DIAGNOSIS — E1169 Type 2 diabetes mellitus with other specified complication: Secondary | ICD-10-CM

## 2021-12-16 LAB — POCT GLYCOSYLATED HEMOGLOBIN (HGB A1C): Hemoglobin A1C: 9.4 % — AB (ref 4.0–5.6)

## 2021-12-16 MED ORDER — VALACYCLOVIR HCL 500 MG PO TABS: 500.0000 mg | ORAL_TABLET | Freq: Every day | ORAL | 0 refills | Status: DC

## 2021-12-16 MED ORDER — LISINOPRIL 40 MG PO TABS: 40.0000 mg | ORAL_TABLET | Freq: Every day | ORAL | 3 refills | Status: DC

## 2021-12-16 MED ORDER — METFORMIN HCL 500 MG PO TABS: 500.0000 mg | ORAL_TABLET | Freq: Two times a day (BID) | ORAL | 3 refills | Status: DC

## 2021-12-16 MED ORDER — AMLODIPINE BESYLATE 5 MG PO TABS: 5.0000 mg | ORAL_TABLET | Freq: Every day | ORAL | 3 refills | Status: DC

## 2021-12-16 MED ORDER — ROSUVASTATIN CALCIUM 10 MG PO TABS: 10.0000 mg | ORAL_TABLET | Freq: Every day | ORAL | 3 refills | Status: DC

## 2021-12-16 MED ORDER — EMPAGLIFLOZIN 10 MG PO TABS: 10.0000 mg | ORAL_TABLET | Freq: Every day | ORAL | 0 refills | Status: DC

## 2021-12-16 NOTE — Patient Instructions (Signed)
  Regards,   Lyvia Mondesir FNP-C  

## 2021-12-16 NOTE — Progress Notes (Signed)
Established Patient Office Visit  Subjective:  Patient ID: Lori Beasley, female    DOB: 08/01/1970  Age: 51 y.o. MRN: [id number]  CC:  Chief Complaint  Patient presents with   Diabetes    HPI Lori Beasley is here today for follow up.   Pt is with acute concerns.  DM2: stopped jardiance last visit, took [place] for about one month however ended up with another yeast infection and decided to stop the medication. The [place] was expensive as well it was about 75$ for one month supply.  Tried to increase metformin in the past to 1000 mg twice daily but caused diarrhea. She does want to try jardiance again because she really like dit.   Lab Results  Component Value Date   HGBA1C 7.7 (H) 09/05/2021       Past Medical History:  Diagnosis Date   COVID-19 virus infection    Diabetes ([place])    Hyperlipidemia    Hypertension     Past Surgical History:  Procedure Laterality Date   CESAREAN SECTION     HAND SURGERY     bil cyst removed from hands    Family History  Problem Relation Age of Onset   Thyroid disease Mother    Heart disease Father    Colon polyps Father    Colon polyps Brother    Breast cancer Maternal Grandmother        over 80   Colon cancer Neg Hx    Esophageal cancer Neg Hx    Rectal cancer Neg Hx    Stomach cancer Neg Hx     Social History   Socioeconomic History   Marital status: Significant Other    Spouse name: Not on file   Number of children: 2   Years of education: Not on file   Highest education level: Not on file  Occupational History    Employer: [employer]  Tobacco Use   Smoking status: Never   Smokeless tobacco: Never  Vaping Use   Vaping Use: Never used  Substance and Sexual Activity   Alcohol use: Yes    Comment: occ rare    Drug use: No   Sexual activity: Yes    Birth control/protection: None  Other Topics Concern   Not on file  Social History Narrative   Not on file   Social Determinants of Health   Financial  Resource Strain: Not on file  Food Insecurity: Not on file  Transportation Needs: Not on file  Physical Activity: Not on file  Stress: Not on file  Social Connections: Not on file  Intimate Partner Violence: Not on file    Outpatient Medications Prior to Visit  Medication Sig Dispense Refill   Blood Glucose Monitoring Suppl (FIFTY50 GLUCOSE METER 2.0) w/Device KIT      glucose blood test strip      Lancets MISC      amLODipine (NORVASC) 5 MG tablet TAKE 1 TABLET BY MOUTH  DAILY 90 tablet 3   lisinopril (ZESTRIL) 40 MG tablet Take 1 tablet (40 mg total) by mouth daily. 90 tablet 1   metFORMIN (GLUCOPHAGE) 500 MG tablet Take 1 tablet (500 mg total) by mouth 2 (two) times daily with a meal. 60 tablet 3   rosuvastatin (CRESTOR) 10 MG tablet Take 1 tablet (10 mg total) by mouth at bedtime. 90 tablet 3   sitaGLIPtin (JANUVIA) 50 MG tablet Take 1 tablet (50 mg total) by mouth daily. 90 tablet 1   valACYclovir (  VALTREX) 500 MG tablet Take 1 tablet (500 mg total) by mouth daily. 90 tablet 0   No facility-administered medications prior to visit.    Allergies  Allergen Reactions   Januvia [Sitagliptin] Other (See Comments)    Yeast infection    Jardiance [Empagliflozin]     uti's        Objective:    Physical Exam Constitutional:      General: She is not in acute distress.    Appearance: Normal appearance. She is normal weight. She is not ill-appearing, toxic-appearing or diaphoretic.  Cardiovascular:     Rate and Rhythm: Normal rate and regular rhythm.  Pulmonary:     Effort: Pulmonary effort is normal.  Neurological:     General: No focal deficit present.     Mental Status: She is alert and oriented to person, place, and time. Mental status is at baseline.  Psychiatric:        Mood and Affect: Mood normal.        Behavior: Behavior normal.        Thought Content: Thought content normal.        Judgment: Judgment normal.      BP 128/78   Pulse 95   Temp 98.3 F (36.8  C)   Resp 16   Ht 5' 3.25" (1.607 m)   Wt 132 lb (59.9 kg)   LMP 07/13/2017   SpO2 99%   BMI 23.20 kg/m  Wt Readings from Last 3 Encounters:  12/16/21 132 lb (59.9 kg)  09/12/21 133 lb 4 oz (60.4 kg)  09/05/21 136 lb 6.4 oz (61.9 kg)     Health Maintenance Due  Topic Date Due   Zoster Vaccines- Shingrix (1 of 2) Never done   COVID-19 Vaccine (3 - Pfizer risk series) 05/22/2019   PAP SMEAR-Modifier  02/25/2021   INFLUENZA VACCINE  10/11/2021    There are no preventive care reminders to display for this patient.  Lab Results  Component Value Date   TSH 1.260 09/05/2021   Lab Results  Component Value Date   WBC 9.2 09/05/2021   HGB 13.2 09/05/2021   HCT 39.4 09/05/2021   MCV 90 09/05/2021   PLT 310 09/05/2021   Lab Results  Component Value Date   NA 141 09/05/2021   K 4.4 09/05/2021   CO2 26 09/05/2021   GLUCOSE 163 (H) 09/05/2021   BUN 19 09/05/2021   CREATININE 1.11 (H) 09/05/2021   BILITOT 0.2 09/05/2021   ALKPHOS 86 09/05/2021   AST 18 09/05/2021   ALT 16 09/05/2021   PROT 6.9 09/05/2021   ALBUMIN 4.5 09/05/2021   CALCIUM 10.1 09/05/2021   EGFR 60 09/05/2021   GFR 68.75 08/22/2017   Lab Results  Component Value Date   CHOL 198 09/12/2021   Lab Results  Component Value Date   HDL 88 09/12/2021   Lab Results  Component Value Date   LDLCALC 99 09/12/2021   Lab Results  Component Value Date   TRIG 62 09/12/2021   Lab Results  Component Value Date   CHOLHDL 2.3 09/12/2021   Lab Results  Component Value Date   HGBA1C 7.7 (H) 09/05/2021      Assessment & Plan:   Problem List Items Addressed This Visit       Cardiovascular and Mediastinum   Essential hypertension    Continue amlodipine 5 mg and lisinopril 40 mg once daily      Relevant Medications   lisinopril (ZESTRIL) 40 MG tablet  amLODipine (NORVASC) 5 MG tablet   rosuvastatin (CRESTOR) 10 MG tablet     Endocrine   Diabetes mellitus without complication (HCC)     Worsening advised patient on compliance  Sending in [place] per patient request that she would like to try this again did recommend and advised patient to, use a water bottle next to the toilet and spritz after every void to reduce the risk of recurrent UTI or yeast infection from [place].  Also advised to increase water intake to about 64 ounces daily if able.  Work on diabetic diet exercise as tolerated follow-up in a strict 3 months with diabetic diet and exercise as tolerated      Relevant Medications   empagliflozin (JARDIANCE) 10 MG TABS tablet   lisinopril (ZESTRIL) 40 MG tablet   metFORMIN (GLUCOPHAGE) 500 MG tablet   rosuvastatin (CRESTOR) 10 MG tablet     Other   Hyperlipidemia    Continue rosuvastatin 10 mg       Relevant Medications   lisinopril (ZESTRIL) 40 MG tablet   amLODipine (NORVASC) 5 MG tablet   rosuvastatin (CRESTOR) 10 MG tablet   Herpes simplex infection    Refill Valtrex sent to pharmacy      Relevant Medications   valACYclovir (VALTREX) 500 MG tablet   Other Visit Diagnoses     Type 2 diabetes mellitus with other specified complication, without long-term current use of insulin (HCC)    -  Primary   Relevant Medications   empagliflozin (JARDIANCE) 10 MG TABS tablet   lisinopril (ZESTRIL) 40 MG tablet   metFORMIN (GLUCOPHAGE) 500 MG tablet   rosuvastatin (CRESTOR) 10 MG tablet   Other Relevant Orders   POCT glycosylated hemoglobin (Hb A1C)   Need for shingles vaccine       Relevant Orders   Varicella-zoster vaccine IM       Meds ordered this encounter  Medications   empagliflozin (JARDIANCE) 10 MG TABS tablet    Sig: Take 1 tablet (10 mg total) by mouth daily before breakfast.    Dispense:  90 tablet    Refill:  0    Order Specific Question:   Supervising Provider    Answer:   BEDSOLE, AMY E [[id number]]   valACYclovir (VALTREX) 500 MG tablet    Sig: Take 1 tablet (500 mg total) by mouth daily.    Dispense:  90 tablet    Refill:  0     Order Specific Question:   Supervising Provider    Answer:   BEDSOLE, AMY E [[id number]]   lisinopril (ZESTRIL) 40 MG tablet    Sig: Take 1 tablet (40 mg total) by mouth daily.    Dispense:  90 tablet    Refill:  3    Requesting 1 year supply    Order Specific Question:   Supervising Provider    Answer:   BEDSOLE, AMY E [[id number]]   amLODipine (NORVASC) 5 MG tablet    Sig: Take 1 tablet (5 mg total) by mouth daily.    Dispense:  90 tablet    Refill:  3    Requesting 1 year supply    Order Specific Question:   Supervising Provider    Answer:   BEDSOLE, AMY E [[id number]]   metFORMIN (GLUCOPHAGE) 500 MG tablet    Sig: Take 1 tablet (500 mg total) by mouth 2 (two) times daily with a meal.    Dispense:  180 tablet    Refill:  3    Order  Specific Question:   Supervising Provider    Answer:   Diona Browner, AMY E [[id number]]   rosuvastatin (CRESTOR) 10 MG tablet    Sig: Take 1 tablet (10 mg total) by mouth at bedtime.    Dispense:  90 tablet    Refill:  3    Order Specific Question:   Supervising Provider    Answer:   Diona Browner, AMY E [[id number]]    Follow-up: Return in about 3 months (around 03/18/2022) for f/u diabetes.    Eugenia Pancoast, FNP

## 2021-12-16 NOTE — Assessment & Plan Note (Signed)
Refill Valtrex sent to pharmacy

## 2021-12-16 NOTE — Assessment & Plan Note (Signed)
Continue rosuvastatin 10 mg

## 2021-12-16 NOTE — Assessment & Plan Note (Signed)
Continue amlodipine 5 mg and lisinopril 40 mg once daily

## 2021-12-16 NOTE — Assessment & Plan Note (Signed)
Worsening advised patient on compliance  Sending in [place] per patient request that she would like to try this again did recommend and advised patient to, use a water bottle next to the toilet and spritz after every void to reduce the risk of recurrent UTI or yeast infection from Lori Beasley.  Also advised to increase water intake to about 64 ounces daily if able.  Work on diabetic diet exercise as tolerated follow-up in a strict 3 months with diabetic diet and exercise as tolerated

## 2022-03-20 ENCOUNTER — Ambulatory Visit: Payer: Managed Care, Other (non HMO) | Admitting: Family

## 2022-03-24 ENCOUNTER — Ambulatory Visit: Payer: Managed Care, Other (non HMO) | Admitting: Family

## 2022-03-31 ENCOUNTER — Ambulatory Visit: Payer: Managed Care, Other (non HMO) | Admitting: Family

## 2022-05-12 ENCOUNTER — Encounter: Payer: Self-pay | Admitting: Family

## 2022-05-12 ENCOUNTER — Ambulatory Visit: Payer: Managed Care, Other (non HMO) | Admitting: Family

## 2022-05-12 ENCOUNTER — Ambulatory Visit (INDEPENDENT_AMBULATORY_CARE_PROVIDER_SITE_OTHER): Payer: Managed Care, Other (non HMO) | Admitting: Family

## 2022-05-12 VITALS — BP 144/88 | HR 88 | Temp 98.0°F | Ht 63.25 in | Wt 129.4 lb

## 2022-05-12 DIAGNOSIS — E119 Type 2 diabetes mellitus without complications: Secondary | ICD-10-CM | POA: Diagnosis not present

## 2022-05-12 DIAGNOSIS — Z23 Encounter for immunization: Secondary | ICD-10-CM | POA: Diagnosis not present

## 2022-05-12 DIAGNOSIS — I10 Essential (primary) hypertension: Secondary | ICD-10-CM

## 2022-05-12 DIAGNOSIS — E782 Mixed hyperlipidemia: Secondary | ICD-10-CM

## 2022-05-12 NOTE — Progress Notes (Signed)
Established Patient Office Visit  Subjective:      CC:  Chief Complaint  Patient presents with   Medical Management of Chronic Issues    HPI: Lori Beasley is a 52 y.o. female presenting on 05/12/2022 for Medical Management of Chronic Issues .  DM2: fasting glucose in am averaging in higher 100's. She does state she stopped taking [place], she stopped because she thought it was causing her some weight loss.  Eye exam , last summer utd on annual screening Checks feet daily.  Lab Results  Component Value Date   HGBA1C 9.4 (A) 12/16/2021   Wt Readings from Last 3 Encounters:  05/12/22 129 lb 6.4 oz (58.7 kg)  12/16/21 132 lb (59.9 kg)  09/12/21 133 lb 4 oz (60.4 kg)   Also here for second shingles vaccination         Social history:  Relevant past medical, surgical, family and social history reviewed and updated as indicated. Interim medical history since our last visit reviewed.  Allergies and medications reviewed and updated.  DATA REVIEWED: CHART IN EPIC     ROS: Negative unless specifically indicated above in HPI.    Current Outpatient Medications:    amLODipine (NORVASC) 5 MG tablet, Take 1 tablet (5 mg total) by mouth daily., Disp: 90 tablet, Rfl: 3   Blood Glucose Monitoring Suppl (FIFTY50 GLUCOSE METER 2.0) w/Device KIT, , Disp: , Rfl:    glucose blood test strip, , Disp: , Rfl:    Lancets MISC, , Disp: , Rfl:    lisinopril (ZESTRIL) 40 MG tablet, Take 1 tablet (40 mg total) by mouth daily., Disp: 90 tablet, Rfl: 3   metFORMIN (GLUCOPHAGE) 500 MG tablet, Take 1 tablet (500 mg total) by mouth 2 (two) times daily with a meal., Disp: 180 tablet, Rfl: 3   rosuvastatin (CRESTOR) 10 MG tablet, Take 1 tablet (10 mg total) by mouth at bedtime., Disp: 90 tablet, Rfl: 3   valACYclovir (VALTREX) 500 MG tablet, Take 1 tablet (500 mg total) by mouth daily., Disp: 90 tablet, Rfl: 0      Objective:    BP (!) 144/88   Pulse 88   Temp 98 F (36.7 C)  (Temporal)   Ht 5' 3.25" (1.607 m)   Wt 129 lb 6.4 oz (58.7 kg)   LMP 07/13/2017   SpO2 98%   BMI 22.74 kg/m   Wt Readings from Last 3 Encounters:  05/12/22 129 lb 6.4 oz (58.7 kg)  12/16/21 132 lb (59.9 kg)  09/12/21 133 lb 4 oz (60.4 kg)    Physical Exam Constitutional:      General: She is not in acute distress.    Appearance: Normal appearance. She is normal weight. She is not ill-appearing, toxic-appearing or diaphoretic.  HENT:     Head: Normocephalic.  Cardiovascular:     Rate and Rhythm: Normal rate and regular rhythm.  Pulmonary:     Effort: Pulmonary effort is normal.  Musculoskeletal:        General: Normal range of motion.     Right lower leg: No edema.     Left lower leg: No edema.  Neurological:     General: No focal deficit present.     Mental Status: She is alert and oriented to person, place, and time. Mental status is at baseline.  Psychiatric:        Mood and Affect: Mood normal.        Behavior: Behavior normal.  Thought Content: Thought content normal.        Judgment: Judgment normal.            Assessment & Plan:  Need for shingles vaccine -     Varicella-zoster vaccine IM -     Comprehensive metabolic panel -     Lipid panel -     Microalbumin / creatinine urine ratio -     Hemoglobin A1c  Diabetes mellitus without complication [facility]) Assessment & Plan: Ordered hga1c today pending results. Work on diabetic diet and exercise as tolerated. Yearly foot exam, and annual eye exam.  If hga1c >8 will consider glipizide Pt ok with starting jardiance trial again if hga1c in 7 range.  Discussed with patient compliance, and if she stops medication to please reach out to me prior to appointment so we can consider alternative medication. Did d/w pt that side effect of weight loss is generally not related to SGLT2 and advised pt of reduced CVD risk as well as protection of kidneys. Will also order urine microalbumin.    Mixed  hyperlipidemia Assessment & Plan: Ordered lipid panel, pending results. Work on low cholesterol diet and exercise as tolerated Continue rosuvastatin 10 mg nightly.    Essential hypertension -     Microalbumin / creatinine urine ratio     Return in about 6 months (around 11/12/2022) for f/u diabetes, f/u CPE.  Eugenia Pancoast, MSN, APRN, FNP-C [place]

## 2022-05-12 NOTE — Assessment & Plan Note (Signed)
Ordered hga1c today pending results. Work on diabetic diet and exercise as tolerated. Yearly foot exam, and annual eye exam.  If hga1c >8 will consider glipizide Pt ok with starting jardiance trial again if hga1c in 7 range.  Discussed with patient compliance, and if she stops medication to please reach out to me prior to appointment so we can consider alternative medication. Did d/w pt that side effect of weight loss is generally not related to SGLT2 and advised pt of reduced CVD risk as well as protection of kidneys. Will also order urine microalbumin.

## 2022-05-12 NOTE — Assessment & Plan Note (Signed)
Ordered lipid panel, pending results. Work on low cholesterol diet and exercise as tolerated Continue rosuvastatin 10 mg nightly.

## 2022-05-13 LAB — LIPID PANEL
Chol/HDL Ratio: 2 ratio (ref 0.0–4.4)
Cholesterol, Total: 164 mg/dL (ref 100–199)
HDL: 81 mg/dL (ref >39)
LDL Chol Calc (NIH): 72 mg/dL (ref 0–99)
Triglycerides: 51 mg/dL (ref 0–149)
VLDL Cholesterol Cal: 11 mg/dL (ref 5–40)

## 2022-05-13 LAB — COMPREHENSIVE METABOLIC PANEL
ALT: 24 IU/L (ref 0–32)
AST: 20 IU/L (ref 0–40)
Albumin/Globulin Ratio: 2 (ref 1.2–2.2)
Albumin: 4.8 g/dL (ref 3.8–4.9)
Alkaline Phosphatase: 80 IU/L (ref 44–121)
BUN/Creatinine Ratio: 18 (ref 9–23)
BUN: 13 mg/dL (ref 6–24)
Bilirubin Total: 0.3 mg/dL (ref 0.0–1.2)
CO2: 24 mmol/L (ref 20–29)
Calcium: 10.4 mg/dL — ABNORMAL HIGH (ref 8.7–10.2)
Chloride: 101 mmol/L (ref 96–106)
Creatinine, Ser: 0.73 mg/dL (ref 0.57–1.00)
Globulin, Total: 2.4 g/dL (ref 1.5–4.5)
Glucose: 186 mg/dL — ABNORMAL HIGH (ref 70–99)
Potassium: 4.3 mmol/L (ref 3.5–5.2)
Sodium: 142 mmol/L (ref 134–144)
Total Protein: 7.2 g/dL (ref 6.0–8.5)
eGFR: 98 mL/min/{1.73_m2} (ref >59)

## 2022-05-13 LAB — HEMOGLOBIN A1C
Est. average glucose Bld gHb Est-mCnc: 237 mg/dL
Hgb A1c MFr Bld: 9.9 % — ABNORMAL HIGH (ref 4.8–5.6)

## 2022-05-13 LAB — MICROALBUMIN / CREATININE URINE RATIO
Creatinine, Urine: 25.8 mg/dL
Microalb/Creat Ratio: <12 mg/g creat (ref 0–29)
Microalbumin, Urine: <3 ug/mL

## 2022-05-15 ENCOUNTER — Other Ambulatory Visit: Payer: Self-pay | Admitting: Family

## 2022-05-15 DIAGNOSIS — E119 Type 2 diabetes mellitus without complications: Secondary | ICD-10-CM

## 2022-05-15 MED ORDER — GLIPIZIDE 5 MG PO TABS: 5.0000 mg | ORAL_TABLET | Freq: Two times a day (BID) | ORAL | 3 refills | Status: DC

## 2022-05-15 NOTE — Progress Notes (Signed)
Hga1c for diabetes is much worse.  Start glipizide 5 mg twice daily. Make sure pt makes one month f/u.  Ask if she is checking sugars at home. Keep glucose log daily for fasting glucose. Report back to me if regularly >120 on a regular basis as we likely need more strict control.

## 2022-06-22 ENCOUNTER — Ambulatory Visit: Payer: Managed Care, Other (non HMO) | Admitting: Family

## 2022-08-24 ENCOUNTER — Other Ambulatory Visit: Payer: Self-pay | Admitting: Physician Assistant

## 2022-08-24 DIAGNOSIS — B009 Herpesviral infection, unspecified: Secondary | ICD-10-CM

## 2022-09-12 ENCOUNTER — Telehealth: Payer: Self-pay | Admitting: Family

## 2022-09-12 DIAGNOSIS — B009 Herpesviral infection, unspecified: Secondary | ICD-10-CM

## 2022-09-12 DIAGNOSIS — E119 Type 2 diabetes mellitus without complications: Secondary | ICD-10-CM

## 2022-09-12 NOTE — Telephone Encounter (Signed)
Prescription Request  09/12/2022  LOV: 05/12/2022  What is the name of the medication or equipment? glipiZIDE (GLUCOTROL) 5 MG tablet valACYclovir (VALTREX) 500 MG tablet   Have you contacted your pharmacy to request a refill? No   Which pharmacy would you like this sent to?   [id number] [place], [place] - [address] AT [facility] [address] [place] [address] Phone: [phone] Fax: [phone]  Patient notified that their request is being sent to the clinical staff for review and that they should receive a response within 2 business days.   Please advise at Mobile [phone] (mobile)

## 2022-09-13 MED ORDER — VALACYCLOVIR HCL 500 MG PO TABS: ORAL_TABLET | ORAL | 3 refills | Status: DC

## 2022-09-13 MED ORDER — GLIPIZIDE 5 MG PO TABS: 5.0000 mg | ORAL_TABLET | Freq: Two times a day (BID) | ORAL | 3 refills | Status: DC

## 2022-10-17 ENCOUNTER — Other Ambulatory Visit: Payer: Self-pay | Admitting: Family

## 2022-10-17 DIAGNOSIS — I10 Essential (primary) hypertension: Secondary | ICD-10-CM

## 2022-11-02 ENCOUNTER — Other Ambulatory Visit: Payer: Self-pay | Admitting: Family

## 2022-11-02 DIAGNOSIS — E1169 Type 2 diabetes mellitus with other specified complication: Secondary | ICD-10-CM

## 2022-11-17 ENCOUNTER — Encounter: Payer: Managed Care, Other (non HMO) | Admitting: Family

## 2023-01-04 ENCOUNTER — Ambulatory Visit (INDEPENDENT_AMBULATORY_CARE_PROVIDER_SITE_OTHER): Payer: Managed Care, Other (non HMO) | Admitting: Family

## 2023-01-04 ENCOUNTER — Encounter: Payer: Self-pay | Admitting: Family

## 2023-01-04 DIAGNOSIS — E559 Vitamin D deficiency, unspecified: Secondary | ICD-10-CM

## 2023-01-04 DIAGNOSIS — I10 Essential (primary) hypertension: Secondary | ICD-10-CM

## 2023-01-04 DIAGNOSIS — B009 Herpesviral infection, unspecified: Secondary | ICD-10-CM | POA: Diagnosis not present

## 2023-01-04 DIAGNOSIS — E782 Mixed hyperlipidemia: Secondary | ICD-10-CM

## 2023-01-04 DIAGNOSIS — E119 Type 2 diabetes mellitus without complications: Secondary | ICD-10-CM

## 2023-01-04 DIAGNOSIS — Z7984 Long term (current) use of oral hypoglycemic drugs: Secondary | ICD-10-CM

## 2023-01-04 MED ORDER — GLIPIZIDE 5 MG PO TABS: 5.0000 mg | ORAL_TABLET | Freq: Two times a day (BID) | ORAL | 3 refills | Status: DC

## 2023-01-04 NOTE — Assessment & Plan Note (Addendum)
Non compliant, continue jardiance 10 mg pending results of A1C . Pt prefers to stop jardiance if able (says it makes her lose weight and too expensive). Refill sent in for glipizide 5 mg twice daily to restart. Continue metformin 500 mg twice daily.   A1c today pending results.  Eye exam up to date.

## 2023-01-04 NOTE — Assessment & Plan Note (Signed)
Continue amlodipine 5 mg and lisinopril 40 mg once daily Stable today  Follow a low sodium diet

## 2023-01-04 NOTE — Assessment & Plan Note (Signed)
Elevated calcium last lab, ordered vitamin d and pending results.

## 2023-01-04 NOTE — Assessment & Plan Note (Signed)
Start taking daily valtrex for suppression as > 10 episode x one year.

## 2023-01-04 NOTE — Assessment & Plan Note (Signed)
Work on low cholesterol diet and exercise as tolerated Continue rosuvastatin 10 mg nightly.

## 2023-01-04 NOTE — Progress Notes (Signed)
Established Patient Office Visit  Subjective:      CC:  Chief Complaint  Patient presents with   Diabetes    HPI: Lori Beasley is a 52 y.o. female presenting on 01/04/2023 for Diabetes . DM2: has been checking glucose at home in the 130's on average. Back in march was started on glipizide 5 mg twice daily however ran out a few months ago as was overdue for her appointment. She had some leftover jardiance 10 mg so she restarted this. Prior states had made her lose weight which was undesirable and it is doing this again she just didn't know what to do without her glipizide. She is due for her eye exam.  Lab Results  Component Value Date   HGBA1C 9.9 (H) 05/12/2022   Wt Readings from Last 3 Encounters:  01/04/23 130 lb (59 kg)  05/12/22 129 lb 6.4 oz (58.7 kg)  12/16/21 132 lb (59.9 kg)   HLD: still taking rosuvastatin 10 mg tolerating well.  Lab Results  Component Value Date   CHOL 164 05/12/2022   HDL 81 05/12/2022   LDLCALC 72 05/12/2022   TRIG 51 05/12/2022   CHOLHDL 2.0 05/12/2022   HSV, has > 10 outbreaks a year, with increased stress. She is on valtrex but has only been taking it as needed.       Social history:  Relevant past medical, surgical, family and social history reviewed and updated as indicated. Interim medical history since our last visit reviewed.  Allergies and medications reviewed and updated.  DATA REVIEWED: CHART IN EPIC     ROS: Negative unless specifically indicated above in HPI.    Current Outpatient Medications:    amLODipine (NORVASC) 5 MG tablet, TAKE 1 TABLET BY MOUTH DAILY, Disp: 90 tablet, Rfl: 3   Blood Glucose Monitoring Suppl (FIFTY50 GLUCOSE METER 2.0) w/Device KIT, , Disp: , Rfl:    glucose blood test strip, , Disp: , Rfl:    Lancets MISC, , Disp: , Rfl:    lisinopril (ZESTRIL) 40 MG tablet, Take 1 tablet (40 mg total) by mouth daily., Disp: 90 tablet, Rfl: 3   metFORMIN (GLUCOPHAGE) 500 MG tablet, TAKE 1 TABLET BY  MOUTH TWICE  DAILY WITH A MEAL, Disp: 180 tablet, Rfl: 3   rosuvastatin (CRESTOR) 10 MG tablet, Take 1 tablet (10 mg total) by mouth at bedtime., Disp: 90 tablet, Rfl: 3   valACYclovir (VALTREX) 500 MG tablet, TAKE 1 TABLET(500 MG) BY MOUTH DAILY, Disp: 90 tablet, Rfl: 3   glipiZIDE (GLUCOTROL) 5 MG tablet, Take 1 tablet (5 mg total) by mouth 2 (two) times daily before a meal., Disp: 180 tablet, Rfl: 3      Objective:    BP (!) 142/84 (BP Location: Left Arm, Patient Position: Sitting, Cuff Size: Normal)   Pulse 84   Temp (!) 97.3 F (36.3 C) (Temporal)   Ht 5\' 3"  (1.6 m)   Wt 130 lb (59 kg)   LMP 07/13/2017   SpO2 100%   BMI 23.03 kg/m   Wt Readings from Last 3 Encounters:  01/04/23 130 lb (59 kg)  05/12/22 129 lb 6.4 oz (58.7 kg)  12/16/21 132 lb (59.9 kg)    Physical Exam Constitutional:      General: She is not in acute distress.    Appearance: Normal appearance. She is normal weight. She is not ill-appearing, toxic-appearing or diaphoretic.  HENT:     Head: Normocephalic.  Cardiovascular:     Rate and Rhythm:  Normal rate.  Pulmonary:     Effort: Pulmonary effort is normal.  Musculoskeletal:        General: Normal range of motion.  Neurological:     General: No focal deficit present.     Mental Status: She is alert and oriented to person, place, and time. Mental status is at baseline.  Psychiatric:        Mood and Affect: Mood normal.        Behavior: Behavior normal.        Thought Content: Thought content normal.        Judgment: Judgment normal.           Assessment & Plan:  Serum calcium elevated -     Basic metabolic panel  Diabetes mellitus without complication (HCC) Assessment & Plan: Non compliant, continue jardiance 10 mg pending results of A1C . Pt prefers to stop jardiance if able (says it makes her lose weight and too expensive). Refill sent in for glipizide 5 mg twice daily to restart. Continue metformin 500 mg twice daily.   A1c today  pending results.  Eye exam up to date.    Orders: -     glipiZIDE; Take 1 tablet (5 mg total) by mouth 2 (two) times daily before a meal.  Dispense: 180 tablet; Refill: 3 -     Basic metabolic panel -     Hemoglobin A1c  Essential hypertension Assessment & Plan: Continue amlodipine 5 mg and lisinopril 40 mg once daily Stable today  Follow a low sodium diet   Vitamin D deficiency Assessment & Plan: Elevated calcium last lab, ordered vitamin d and pending results.   Orders: -     VITAMIN D 25 Hydroxy (Vit-D Deficiency, Fractures)  Herpes simplex infection Assessment & Plan: Start taking daily valtrex for suppression as > 10 episode x one year.   Mixed hyperlipidemia Assessment & Plan: Work on low cholesterol diet and exercise as tolerated Continue rosuvastatin 10 mg nightly.       Return in about 3 months (around 04/06/2023) for f/u diabetes.  Mort Sawyers, MSN, APRN, FNP-C [facility]

## 2023-01-05 LAB — HEMOGLOBIN A1C
Est. average glucose Bld gHb Est-mCnc: 229 mg/dL
Hgb A1c MFr Bld: 9.6 % — ABNORMAL HIGH (ref 4.8–5.6)

## 2023-01-05 LAB — BASIC METABOLIC PANEL
BUN/Creatinine Ratio: 25 — ABNORMAL HIGH (ref 9–23)
BUN: 19 mg/dL (ref 6–24)
CO2: 21 mmol/L (ref 20–29)
Calcium: 10.2 mg/dL (ref 8.7–10.2)
Chloride: 100 mmol/L (ref 96–106)
Creatinine, Ser: 0.76 mg/dL (ref 0.57–1.00)
Glucose: 117 mg/dL — ABNORMAL HIGH (ref 70–99)
Potassium: 4.4 mmol/L (ref 3.5–5.2)
Sodium: 140 mmol/L (ref 134–144)
eGFR: 94 mL/min/{1.73_m2} (ref >59)

## 2023-01-05 LAB — VITAMIN D 25 HYDROXY (VIT D DEFICIENCY, FRACTURES): Vit D, 25-Hydroxy: 113 ng/mL — ABNORMAL HIGH (ref 30.0–100.0)

## 2023-01-08 ENCOUNTER — Other Ambulatory Visit: Payer: Self-pay | Admitting: Family

## 2023-01-08 DIAGNOSIS — E119 Type 2 diabetes mellitus without complications: Secondary | ICD-10-CM

## 2023-01-08 MED ORDER — EMPAGLIFLOZIN 10 MG PO TABS: 10.0000 mg | ORAL_TABLET | Freq: Every day | ORAL | 3 refills | Status: DC

## 2023-01-08 MED ORDER — DAPAGLIFLOZIN PROPANEDIOL 5 MG PO TABS: 5.0000 mg | ORAL_TABLET | Freq: Every day | ORAL | 0 refills | Status: DC

## 2023-01-08 NOTE — Progress Notes (Signed)
HGA1C is still very high, I would continue on jardiance and restart glipizide as the number needs to be lower than 6 to avoid diabetic side effects from not having it controlled for years. I do know she said jardiance was too pricey so I am going to send fariga, have her let me know if not affordable. Sometimes insurance will cover one and not the other.

## 2023-01-08 NOTE — Progress Notes (Signed)
Jardiance refill has been sent to phamracy.

## 2023-01-09 ENCOUNTER — Telehealth: Payer: Self-pay | Admitting: Family

## 2023-01-09 DIAGNOSIS — E119 Type 2 diabetes mellitus without complications: Secondary | ICD-10-CM

## 2023-01-09 NOTE — Telephone Encounter (Signed)
Patient called regarding medication empagliflozin (JARDIANCE) 10 MG TABS tablet . Pt asked if she could start [place] instead of this medication? Can be sent to pt's preferred pharmacy of [facility]

## 2023-01-10 NOTE — Telephone Encounter (Signed)
Attempted to contact pt. There was no answer and I could not leave a message due to her VM being full. Will try back.

## 2023-01-10 NOTE — Telephone Encounter (Signed)
  Due to multiple trial medications with intolerances and or side effects, will refer pt to endo for diabetic management. At this time I feel endocrinology would be the best next step as it very important we get her diabetes under control. Have her increase glipizide to 10 mg twice daily continue metformin. She can let me know if she continues jardiance or not.   A referral has been placed. Please let us know if you have not heard back within 2 weeks about the referral.

## 2023-01-11 ENCOUNTER — Other Ambulatory Visit: Payer: Self-pay | Admitting: Family

## 2023-01-11 DIAGNOSIS — I10 Essential (primary) hypertension: Secondary | ICD-10-CM

## 2023-01-11 NOTE — Telephone Encounter (Signed)
Attempted to contact pt. There was no answer and I could not leave a message due to her VM being full. Will try back.

## 2023-01-12 NOTE — Telephone Encounter (Signed)
Attempted to contact pt. There was no answer and I could not leave a message due to her VM being full.

## 2023-01-18 ENCOUNTER — Other Ambulatory Visit: Payer: Self-pay | Admitting: Family

## 2023-01-18 DIAGNOSIS — E782 Mixed hyperlipidemia: Secondary | ICD-10-CM

## 2023-01-22 ENCOUNTER — Ambulatory Visit: Payer: Managed Care, Other (non HMO) | Admitting: "Endocrinology

## 2023-06-12 ENCOUNTER — Other Ambulatory Visit: Payer: Self-pay | Admitting: Family

## 2023-06-12 DIAGNOSIS — I10 Essential (primary) hypertension: Secondary | ICD-10-CM

## 2023-08-02 ENCOUNTER — Other Ambulatory Visit: Payer: Self-pay | Admitting: Family

## 2023-08-02 DIAGNOSIS — Z1231 Encounter for screening mammogram for malignant neoplasm of breast: Secondary | ICD-10-CM

## 2023-08-03 ENCOUNTER — Other Ambulatory Visit: Payer: Self-pay | Admitting: Family

## 2023-08-03 DIAGNOSIS — I10 Essential (primary) hypertension: Secondary | ICD-10-CM

## 2023-08-03 DIAGNOSIS — E1169 Type 2 diabetes mellitus with other specified complication: Secondary | ICD-10-CM

## 2023-08-15 ENCOUNTER — Ambulatory Visit
Admission: RE | Admit: 2023-08-15 | Discharge: 2023-08-15 | Disposition: A | Discharge status: Home / Self Care | Source: Ambulatory Visit | Attending: Family | Admitting: Family

## 2023-08-15 DIAGNOSIS — Z1231 Encounter for screening mammogram for malignant neoplasm of breast: Secondary | ICD-10-CM | POA: Insufficient documentation

## 2023-08-21 ENCOUNTER — Ambulatory Visit: Payer: Self-pay | Admitting: Family

## 2023-09-27 ENCOUNTER — Ambulatory Visit: Admitting: Family

## 2023-09-27 VITALS — BP 138/80 | HR 99 | Temp 98.4°F | Ht 63.0 in | Wt 138.0 lb

## 2023-09-27 DIAGNOSIS — I10 Essential (primary) hypertension: Secondary | ICD-10-CM

## 2023-09-27 DIAGNOSIS — B009 Herpesviral infection, unspecified: Secondary | ICD-10-CM | POA: Diagnosis not present

## 2023-09-27 DIAGNOSIS — E782 Mixed hyperlipidemia: Secondary | ICD-10-CM | POA: Diagnosis not present

## 2023-09-27 DIAGNOSIS — E1169 Type 2 diabetes mellitus with other specified complication: Secondary | ICD-10-CM | POA: Diagnosis not present

## 2023-09-27 DIAGNOSIS — E119 Type 2 diabetes mellitus without complications: Secondary | ICD-10-CM | POA: Insufficient documentation

## 2023-09-27 DIAGNOSIS — E559 Vitamin D deficiency, unspecified: Secondary | ICD-10-CM

## 2023-09-27 DIAGNOSIS — Z7984 Long term (current) use of oral hypoglycemic drugs: Secondary | ICD-10-CM

## 2023-09-27 DIAGNOSIS — Z79899 Other long term (current) drug therapy: Secondary | ICD-10-CM

## 2023-09-27 MED ORDER — VALACYCLOVIR HCL 500 MG PO TABS: ORAL_TABLET | ORAL | 3 refills | Status: AC

## 2023-09-27 MED ORDER — AMLODIPINE BESYLATE 5 MG PO TABS
5.0000 mg | ORAL_TABLET | Freq: Every day | ORAL | 1 refills | Status: AC
Start: 2023-09-27 — End: ?

## 2023-09-27 MED ORDER — METFORMIN HCL 500 MG PO TABS: 500.0000 mg | ORAL_TABLET | Freq: Two times a day (BID) | ORAL | 1 refills | Status: AC

## 2023-09-27 MED ORDER — ROSUVASTATIN CALCIUM 10 MG PO TABS
10.0000 mg | ORAL_TABLET | Freq: Every day | ORAL | 3 refills | Status: AC
Start: 2023-09-27 — End: ?

## 2023-09-27 MED ORDER — LISINOPRIL 40 MG PO TABS: 40.0000 mg | ORAL_TABLET | Freq: Every day | ORAL | 1 refills | Status: DC

## 2023-09-27 NOTE — Progress Notes (Unsigned)
 Established Patient Office Visit  Subjective:      CC:  Chief Complaint  Patient presents with   Diabetes    States she checks her blood sugar about every other day. Fasting ranges 140s-150s.     HPI: Lori Beasley is a 53 y.o. female presenting on 09/27/2023 for Diabetes (States she checks her blood sugar about every other day. Fasting ranges 140s-150s. )   Dm2: she states that she notices that her diabetic glucose numbers are 'all over the place'. Sometimes 120 up to 150. She states there are some times where her sugars dip and she feels dizzy and she'll check her sugar and it is in the 60's or 70's. She is taking metformin  500 mg twice daily and also glipizide  5 mg twice daily. Overdue for her eye exam. Has not had in over a year.   Last lab draw was recommended to increase glipizide  to 10 mg twice daily however pt was unable to be contacted, her VM was full, so 5 mg twice daily was maintained. Lab Results  Component Value Date   HGBA1C 9.6 (H) 01/04/2023   HTN: stable. On lisinopril  40 mg once daily and amlodipine  5 mg once daily.        Social history:  Relevant past medical, surgical, family and social history reviewed and updated as indicated. Interim medical history since our last visit reviewed.  Allergies and medications reviewed and updated.  DATA REVIEWED: CHART IN EPIC     ROS: Negative unless specifically indicated above in HPI.    Current Outpatient Medications:    Blood Glucose Monitoring Suppl (FIFTY50 GLUCOSE METER 2.0) w/Device KIT, , Disp: , Rfl:    glipiZIDE  (GLUCOTROL ) 5 MG tablet, Take 1 tablet (5 mg total) by mouth 2 (two) times daily before a meal., Disp: 180 tablet, Rfl: 3   glucose blood test strip, , Disp: , Rfl:    Lancets MISC, , Disp: , Rfl:    amLODipine  (NORVASC ) 5 MG tablet, Take 1 tablet (5 mg total) by mouth daily., Disp: 90 tablet, Rfl: 1   lisinopril  (ZESTRIL ) 40 MG tablet, Take 1 tablet (40 mg total) by mouth daily.,  Disp: 90 tablet, Rfl: 1   metFORMIN  (GLUCOPHAGE ) 500 MG tablet, Take 1 tablet (500 mg total) by mouth 2 (two) times daily with a meal., Disp: 180 tablet, Rfl: 1   rosuvastatin  (CRESTOR ) 10 MG tablet, Take 1 tablet (10 mg total) by mouth at bedtime., Disp: 90 tablet, Rfl: 3   valACYclovir  (VALTREX ) 500 MG tablet, TAKE 1 TABLET(500 MG) BY MOUTH DAILY, Disp: 90 tablet, Rfl: 3      Objective:    BP 138/80   Pulse 99   Temp 98.4 F (36.9 C) (Oral)   Ht 5' 3 (1.6 m)   Wt 138 lb (62.6 kg)   LMP 07/13/2017   SpO2 97%   BMI 24.45 kg/m   Wt Readings from Last 3 Encounters:  09/27/23 138 lb (62.6 kg)  01/04/23 130 lb (59 kg)  05/12/22 129 lb 6.4 oz (58.7 kg)    Physical Exam Vitals reviewed.  Constitutional:      General: She is not in acute distress.    Appearance: Normal appearance. She is normal weight. She is not ill-appearing, toxic-appearing or diaphoretic.  HENT:     Head: Normocephalic.  Cardiovascular:     Rate and Rhythm: Normal rate.  Pulmonary:     Effort: Pulmonary effort is normal.  Musculoskeletal:  General: Normal range of motion.  Neurological:     General: No focal deficit present.     Mental Status: She is alert and oriented to person, place, and time. Mental status is at baseline.  Psychiatric:        Mood and Affect: Mood normal.        Behavior: Behavior normal.        Thought Content: Thought content normal.        Judgment: Judgment normal.           Assessment & Plan:  Mixed hyperlipidemia -     Lipid panel -     Rosuvastatin  Calcium ; Take 1 tablet (10 mg total) by mouth at bedtime.  Dispense: 90 tablet; Refill: 3  Essential hypertension Assessment & Plan: Continue amlodipine  5 mg and lisinopril  40 mg once daily Stable today  Follow a low sodium diet  Orders: -     amLODIPine  Besylate; Take 1 tablet (5 mg total) by mouth daily.  Dispense: 90 tablet; Refill: 1 -     Lisinopril ; Take 1 tablet (40 mg total) by mouth daily.  Dispense:  90 tablet; Refill: 1  Diabetes mellitus without complication (HCC) Assessment & Plan: Non compliant. Did have long discussion about options moving forward if A1C still eelvated beyond goal.  Pt prefers not to use a GLP 1 as she is worried about weight loss.  She would consider retrial of jardiance  however it was costly and in the past she had suspected it had caused her to lose weight as well.  DPP 4 , januvia , suspected to have caused a yeast infection We could increase glipzide 10 mg twice daily and or increase metformin  but change to XR to be better tolerated as she has had GI upset in the past with higher doses metformin . Advised her I would prefer to add DPP4, SGLT2 and or change metformin  to XR however she prefers that we increase glipizdie if needed. Did d/w higher change of hypoglycemia with glipizide .   A1c today pending results.  Eye exam overdue, pt to make appt.    Orders: -     Microalbumin / creatinine urine ratio -     Hemoglobin A1c -     Comprehensive metabolic panel with GFR  Type 2 diabetes mellitus with other specified complication, without long-term current use of insulin (HCC) -     metFORMIN  HCl; Take 1 tablet (500 mg total) by mouth 2 (two) times daily with a meal.  Dispense: 180 tablet; Refill: 1  Herpes simplex infection -     valACYclovir  HCl; TAKE 1 TABLET(500 MG) BY MOUTH DAILY  Dispense: 90 tablet; Refill: 3  On statin therapy  Vitamin D  deficiency -     VITAMIN D  25 Hydroxy (Vit-D Deficiency, Fractures)     Return in about 3 months (around 12/28/2023) for f/u diabetes.  Ginger Patrick, MSN, APRN, FNP-C [facility]

## 2023-09-28 LAB — LIPID PANEL
Chol/HDL Ratio: 2.3 ratio (ref 0.0–4.4)
Cholesterol, Total: 179 mg/dL (ref 100–199)
HDL: 79 mg/dL (ref >39)
LDL Chol Calc (NIH): 92 mg/dL (ref 0–99)
Triglycerides: 37 mg/dL (ref 0–149)
VLDL Cholesterol Cal: 8 mg/dL (ref 5–40)

## 2023-09-28 LAB — HEMOGLOBIN A1C
Est. average glucose Bld gHb Est-mCnc: 192 mg/dL
Hgb A1c MFr Bld: 8.3 % — ABNORMAL HIGH (ref 4.8–5.6)

## 2023-09-28 LAB — COMPREHENSIVE METABOLIC PANEL WITH GFR
ALT: 16 IU/L (ref 0–32)
AST: 16 IU/L (ref 0–40)
Albumin: 4.7 g/dL (ref 3.8–4.9)
Alkaline Phosphatase: 94 IU/L (ref 44–121)
BUN/Creatinine Ratio: 22 (ref 9–23)
BUN: 18 mg/dL (ref 6–24)
Bilirubin Total: <0.2 mg/dL (ref 0.0–1.2)
CO2: 23 mmol/L (ref 20–29)
Calcium: 10.7 mg/dL — ABNORMAL HIGH (ref 8.7–10.2)
Chloride: 105 mmol/L (ref 96–106)
Creatinine, Ser: 0.81 mg/dL (ref 0.57–1.00)
Globulin, Total: 2.4 g/dL (ref 1.5–4.5)
Glucose: 45 mg/dL — ABNORMAL LOW (ref 70–99)
Potassium: 4.6 mmol/L (ref 3.5–5.2)
Sodium: 144 mmol/L (ref 134–144)
Total Protein: 7.1 g/dL (ref 6.0–8.5)
eGFR: 86 mL/min/1.73 (ref >59)

## 2023-09-28 LAB — MICROALBUMIN / CREATININE URINE RATIO
Creatinine, Urine: 16.3 mg/dL
Microalb/Creat Ratio: <18 mg/g{creat} (ref 0–29)
Microalbumin, Urine: <3 ug/mL

## 2023-09-28 LAB — VITAMIN D 25 HYDROXY (VIT D DEFICIENCY, FRACTURES): Vit D, 25-Hydroxy: 41.1 ng/mL (ref 30.0–100.0)

## 2023-09-28 NOTE — Assessment & Plan Note (Signed)
Continue amlodipine 5 mg and lisinopril 40 mg once daily Stable today  Follow a low sodium diet

## 2023-09-28 NOTE — Assessment & Plan Note (Signed)
 Non compliant. Did have long discussion about options moving forward if A1C still eelvated beyond goal.  Pt prefers not to use a GLP 1 as she is worried about weight loss.  She would consider retrial of jardiance  however it was costly and in the past she had suspected it had caused her to lose weight as well.  DPP 4 , januvia , suspected to have caused a yeast infection We could increase glipzide 10 mg twice daily and or increase metformin  but change to XR to be better tolerated as she has had GI upset in the past with higher doses metformin . Advised her I would prefer to add DPP4, SGLT2 and or change metformin  to XR however she prefers that we increase glipizdie if needed. Did d/w higher change of hypoglycemia with glipizide .   A1c today pending results.  Eye exam overdue, pt to make appt.

## 2023-10-01 ENCOUNTER — Telehealth: Payer: Self-pay

## 2023-10-01 ENCOUNTER — Ambulatory Visit: Payer: Self-pay | Admitting: Family

## 2023-10-01 DIAGNOSIS — E119 Type 2 diabetes mellitus without complications: Secondary | ICD-10-CM

## 2023-10-01 MED ORDER — DAPAGLIFLOZIN PROPANEDIOL 5 MG PO TABS: 5.0000 mg | ORAL_TABLET | Freq: Every day | ORAL | 1 refills | Status: AC

## 2023-10-01 NOTE — Telephone Encounter (Signed)
 I called patient to review recent lab results and patient stated she had another concern. Pt stated on her MyChart under her name and DOB she has diagnosis listed and she requested one to be removed. Inquired about where on her MyChart account she was referring to and per her statement it is right on the front page. I then realized she was referring to her after visit summary. Asked patient if this is what she was referring to, she verified it was and stated she recently had a visit and wants the Herpes Simplex Infection diagnosis removed. I advised patient this is an active diagnosis to which she is currently taking medication (Valtrex ) for so it would not be able to be removed. She stated that this wasn't put on her chart by Tabitha, but by her previous provider. Advised patient that Ginger is now the prescriber of the Valtrex  and has to use a diagnosis in order to prescribe this for her. Patient understood and then insisted that having her diagnosis on the front page of her AVS was a violation of HIPAA. Advised patient that this information is only handled by the appropriate personnel (ie. CMA, Provider) and given directly to the patient, therefore it is not a violation of HIPAA to my understanding. Advised patient she could request an AVS not be printed after her visit or even request a blank sheet to cover the top of it. Pt continued to insist it is a HIPAA violation. Advised patient I did not know what else to tell her and I would be happy to pass this along for a manager to give her a call back. Patient agreed. Patients demeanor remained pleasant through the entire conversation.

## 2023-10-01 NOTE — Telephone Encounter (Signed)
 Phone call to pt, allowed her to voice concerns about Herpes Simplex dx being visible on her AVS and the front page of her chart.  She reported that she knows several people who work at [employer] and is concerned that her information won't be private.  Also offered to help set up pt's mychart, she reported that her daughter also gets all the emails that are sent to her email.  I had pt verify her email address with me and that was what we had on fine.  She reported that all emails go to her daughters email not just [facility].  Attempted to troubleshoot settings for [facility] with patient but she was not in a place that she could access a computer.  She will check the forwarding settings on her email when she is at home.

## 2023-10-02 ENCOUNTER — Other Ambulatory Visit: Payer: Self-pay | Admitting: *Deleted

## 2023-10-02 DIAGNOSIS — E119 Type 2 diabetes mellitus without complications: Secondary | ICD-10-CM

## 2023-10-02 MED ORDER — GLIPIZIDE 5 MG PO TABS: 5.0000 mg | ORAL_TABLET | Freq: Two times a day (BID) | ORAL | 1 refills | Status: DC

## 2024-02-19 ENCOUNTER — Other Ambulatory Visit: Payer: Self-pay | Admitting: Family

## 2024-02-19 DIAGNOSIS — I10 Essential (primary) hypertension: Secondary | ICD-10-CM

## 2024-03-03 ENCOUNTER — Other Ambulatory Visit: Payer: Self-pay | Admitting: *Deleted

## 2024-03-03 DIAGNOSIS — E119 Type 2 diabetes mellitus without complications: Secondary | ICD-10-CM

## 2024-03-03 MED ORDER — GLIPIZIDE 5 MG PO TABS: 5.0000 mg | ORAL_TABLET | Freq: Two times a day (BID) | ORAL | 1 refills | Status: AC
